# Patient Record
Sex: Female | Born: 1965
Health system: Southern US, Community
[De-identification: ages and names within clinical notes are randomized; demographics above are authoritative.]

## PROBLEM LIST (undated history)

## (undated) DIAGNOSIS — M10071 Idiopathic gout, right ankle and foot: Secondary | ICD-10-CM

## (undated) DIAGNOSIS — K5904 Chronic idiopathic constipation: Secondary | ICD-10-CM

## (undated) DIAGNOSIS — Z860101 Personal history of adenomatous and serrated colon polyps: Secondary | ICD-10-CM

## (undated) DIAGNOSIS — F32A Depression, unspecified: Secondary | ICD-10-CM

## (undated) DIAGNOSIS — E039 Hypothyroidism, unspecified: Secondary | ICD-10-CM

## (undated) DIAGNOSIS — D259 Leiomyoma of uterus, unspecified: Secondary | ICD-10-CM

## (undated) DIAGNOSIS — K5900 Constipation, unspecified: Secondary | ICD-10-CM

## (undated) DIAGNOSIS — J309 Allergic rhinitis, unspecified: Secondary | ICD-10-CM

## (undated) DIAGNOSIS — E785 Hyperlipidemia, unspecified: Secondary | ICD-10-CM

## (undated) DIAGNOSIS — H8110 Benign paroxysmal vertigo, unspecified ear: Secondary | ICD-10-CM

## (undated) DIAGNOSIS — E669 Obesity, unspecified: Secondary | ICD-10-CM

## (undated) DIAGNOSIS — R1111 Vomiting without nausea: Secondary | ICD-10-CM

## (undated) DIAGNOSIS — I10 Essential (primary) hypertension: Secondary | ICD-10-CM

## (undated) DIAGNOSIS — L680 Hirsutism: Secondary | ICD-10-CM

## (undated) DIAGNOSIS — R42 Dizziness and giddiness: Secondary | ICD-10-CM

## (undated) HISTORY — DX: Benign paroxysmal vertigo, unspecified ear: H81.10

## (undated) HISTORY — DX: Allergic rhinitis, unspecified: J30.9

## (undated) HISTORY — DX: Hirsutism: L68.0

## (undated) HISTORY — DX: Vomiting without nausea: R11.11

## (undated) HISTORY — PX: CYST EXCISION: SHX5701

## (undated) HISTORY — DX: Constipation, unspecified: K59.00

## (undated) HISTORY — DX: Idiopathic gout, right ankle and foot: M10.071

## (undated) HISTORY — DX: Dizziness and giddiness: R42

## (undated) HISTORY — DX: Depression, unspecified: F32.A

## (undated) HISTORY — DX: Personal history of adenomatous and serrated colon polyps: Z86.0101

## (undated) HISTORY — DX: Hyperlipidemia, unspecified: E78.5

## (undated) HISTORY — DX: Chronic idiopathic constipation: K59.04

## (undated) HISTORY — DX: Hypothyroidism, unspecified: E03.9

---

## 2014-12-20 ENCOUNTER — Encounter (HOSPITAL_COMMUNITY): Payer: Self-pay | Admitting: *Deleted

## 2014-12-20 ENCOUNTER — Emergency Department (HOSPITAL_COMMUNITY)
Admission: EM | Admit: 2014-12-20 | Discharge: 2014-12-20 | Disposition: A | Discharge status: Home / Self Care | Payer: BLUE CROSS/BLUE SHIELD | Source: Home / Self Care | Attending: Family Medicine | Admitting: Family Medicine

## 2014-12-20 ENCOUNTER — Emergency Department (INDEPENDENT_AMBULATORY_CARE_PROVIDER_SITE_OTHER): Payer: BLUE CROSS/BLUE SHIELD

## 2014-12-20 DIAGNOSIS — M722 Plantar fascial fibromatosis: Secondary | ICD-10-CM

## 2014-12-20 HISTORY — DX: Essential (primary) hypertension: I10

## 2014-12-20 MED ORDER — DICLOFENAC POTASSIUM 50 MG PO TABS: 50.0000 mg | ORAL_TABLET | Freq: Three times a day (TID) | ORAL | Status: DC

## 2014-12-20 NOTE — Discharge Instructions (Signed)
Wear good supportive shoes, stretch toes, use medicine as needed and see podiatrist if further problems.

## 2014-12-20 NOTE — ED Notes (Signed)
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

## 2014-12-20 NOTE — ED Provider Notes (Addendum)
CSN: [id number]     Arrival date & time 12/20/14  1340 History   First MD Initiated Contact with Patient 12/20/14 1425     Chief Complaint  Patient presents with  . Foot Pain   (Consider location/radiation/quality/duration/timing/severity/associated sxs/prior Treatment) Patient is a 49 y.o. female presenting with lower extremity pain. The history is provided by the patient.  Foot Pain This is a new problem. The current episode started yesterday. The problem has been gradually worsening. Associated symptoms comments: Works 2 jobs standing all the time, last week toe dorsiflexed in spasm.. The symptoms are aggravated by walking.    Past Medical History  Diagnosis Date  . Hypertension    Past Surgical History  Procedure Laterality Date  . Cyst excision     No family history on file. History  Substance Use Topics  . Smoking status: Never Smoker   . Smokeless tobacco: Not on file  . Alcohol Use: No   OB History    No data available     Review of Systems  Constitutional: Negative.   Musculoskeletal: Positive for joint swelling and gait problem. Negative for back pain.  Skin: Negative.     Allergies  Review of patient's allergies indicates no known allergies.  Home Medications   Prior to Admission medications   Medication Sig Start Date End Date Taking? Authorizing Provider  HYDROCHLOROTHIAZIDE PO Take by mouth.   Yes Historical Provider, MD  diclofenac (CATAFLAM) 50 MG tablet Take 1 tablet (50 mg total) by mouth 3 (three) times daily. 12/20/14   Billy Fischer, MD   BP 158/92 mmHg  Pulse 68  Temp(Src) 98.7 F (37.1 C) (Oral)  Resp 16  SpO2 98%  LMP 11/30/2014 Physical Exam  Constitutional: She is oriented to person, place, and time. She appears well-developed and well-nourished.  Musculoskeletal: She exhibits tenderness.       Right foot: There is tenderness and bony tenderness. There is normal range of motion, no swelling and no deformity.        Feet:  Neurological: She is alert and oriented to person, place, and time.  Skin: Skin is warm and dry.  Nursing note and vitals reviewed.   ED Course  Procedures (including critical care time) Labs Review Labs Reviewed - No data to display  Imaging Review Dg Foot Complete Right  12/20/2014   CLINICAL DATA:  Pain in the great toe.  No indication of trauma.  EXAM: RIGHT FOOT COMPLETE - 3+ VIEW  COMPARISON:  None.  FINDINGS: No fracture, dislocation, or erosion. No significant joint narrowing. No opaque foreign body.  IMPRESSION: No findings to explain great toe pain.   Electronically Signed   By: Jorje Guild M.D.   On: 12/20/2014 15:36     MDM   1. Plantar fasciitis of right foot        Billy Fischer, MD 12/20/14 [place], MD 12/20/14 [phone]

## 2014-12-20 NOTE — ED Notes (Signed)
Pt  reorts    r  Foot  Pain       Since   Last  Pm   She  Reports  May  Have  Bent        Her  Roe back    - she  Has  Pain in the  Affected foot        With  Pain on  Weight  Bearing

## 2014-12-30 ENCOUNTER — Ambulatory Visit: Payer: BLUE CROSS/BLUE SHIELD | Admitting: Podiatry

## 2015-01-21 ENCOUNTER — Encounter (HOSPITAL_COMMUNITY): Payer: Self-pay | Admitting: Emergency Medicine

## 2015-01-21 ENCOUNTER — Emergency Department (HOSPITAL_COMMUNITY)
Admission: EM | Admit: 2015-01-21 | Discharge: 2015-01-21 | Disposition: A | Discharge status: Home / Self Care | Payer: BLUE CROSS/BLUE SHIELD | Source: Home / Self Care | Attending: Family Medicine | Admitting: Family Medicine

## 2015-01-21 DIAGNOSIS — R0789 Other chest pain: Secondary | ICD-10-CM

## 2015-01-21 DIAGNOSIS — K224 Dyskinesia of esophagus: Secondary | ICD-10-CM

## 2015-01-21 NOTE — ED Notes (Signed)
Patient c/o sudden onset of stabbing pain in mid chest aprox 1 H PTA. Pain not reproducible. No change w ROM, deep breathing, direct pressure to sternal area. W/D/color good, NAD

## 2015-01-21 NOTE — ED Notes (Signed)
Pt states that a hour ago she was drinking a beverage and felt sharp chest pain with dizziness along with diaphoretic episode. Which pt states has now resolved.

## 2015-01-21 NOTE — ED Provider Notes (Signed)
CSN: [id number]     Arrival date & time 01/21/15  1233 History   First MD Initiated Contact with Patient 01/21/15 1249     Chief Complaint  Patient presents with  . Chest Pain   (Consider location/radiation/quality/duration/timing/severity/associated sxs/prior Treatment) HPI     49 year old female with history of hypertension presents for evaluation of chest pain. About one hour prior to arrival she took a drink of a cold soda and had immediate onset of stabbing substernal chest pain. She grew lightheaded diaphoretic and felt short of breath. The pain was severe, 10 out of 10, maximal at onset. She sat down and within a couple of minutes the symptoms had completely resolved. She is currently 100% asymptomatic for the past hour, denies any symptoms and denies any pain with exertion. She had never had this before. No recent travel. No leg pain or swelling. No history of DVT or PE. No family history of early heart disease  Past Medical History  Diagnosis Date  . Hypertension    Past Surgical History  Procedure Laterality Date  . Cyst excision     History reviewed. No pertinent family history. History  Substance Use Topics  . Smoking status: Never Smoker   . Smokeless tobacco: Not on file  . Alcohol Use: No   OB History    No data available     Review of Systems  Constitutional: Positive for diaphoresis. Negative for fever and chills.  Eyes: Negative for visual disturbance.  Respiratory: Positive for shortness of breath. Negative for cough.   Cardiovascular: Positive for chest pain. Negative for palpitations and leg swelling.  Gastrointestinal: Negative for nausea, vomiting and abdominal pain.  Endocrine: Negative for polydipsia and polyuria.  Genitourinary: Negative for dysuria, urgency and frequency.  Musculoskeletal: Negative for myalgias and arthralgias.  Skin: Negative for rash.  Neurological: Positive for dizziness. Negative for weakness.  All other systems reviewed and are  negative.   Allergies  Review of patient's allergies indicates no known allergies.  Home Medications   Prior to Admission medications   Medication Sig Start Date End Date Taking? Authorizing Provider  diclofenac (CATAFLAM) 50 MG tablet Take 1 tablet (50 mg total) by mouth 3 (three) times daily. 12/20/14   Billy Fischer, MD  HYDROCHLOROTHIAZIDE PO Take by mouth.    Historical Provider, MD   BP 128/82 mmHg  Temp(Src) 97.8 F (36.6 C) (Oral)  Resp 14  SpO2 96%  LMP 01/18/2015 Physical Exam  Constitutional: She is oriented to person, place, and time. Vital signs are normal. She appears well-developed and well-nourished. No distress.  HENT:  Head: Normocephalic and atraumatic.  Eyes: Conjunctivae are normal. Right eye exhibits no discharge. Left eye exhibits no discharge. No scleral icterus.  No conjunctival pallor  Neck: Normal range of motion. Neck supple.  Cardiovascular: Normal rate, regular rhythm and normal heart sounds.   Pulmonary/Chest: Effort normal and breath sounds normal. No respiratory distress.  Abdominal: Soft. Bowel sounds are normal. She exhibits no distension and no mass. There is tenderness (minimal epigastric and left upper quadrant tenderness). There is no rebound and no guarding.  Lymphadenopathy:    She has no cervical adenopathy.  Neurological: She is alert and oriented to person, place, and time. She has normal strength. Coordination normal.  Skin: Skin is warm and dry. No rash noted. She is not diaphoretic.  Psychiatric: She has a normal mood and affect. Judgment normal.  Nursing note and vitals reviewed.   ED Course  Procedures (including critical  care time) Labs Review Labs Reviewed - No data to display  Imaging Review No results found.   Date: 01/21/2015  Rate: 62   Rhythm: normal sinus rhythm  QRS Axis: normal  Intervals: normal  ST/T Wave abnormalities: normal  Conduction Disutrbances:none  Narrative Interpretation:   Old EKG Reviewed:  none available Normal EKG, and agree with automatic read   MDM   1. Chest pain, atypical   2. Esophageal spasm    Case discussed with attending.  Most likely esophageal spasm.  She is asymptomatic at this time.  If pain returns, call 911.  Otherwise follow up with GI, referral given        Liam Graham, PA-C 01/21/15 1328

## 2015-01-21 NOTE — Discharge Instructions (Signed)
Chest Pain (Nonspecific) °It is often hard to give a specific diagnosis for the cause of chest pain. There is always a chance that your pain could be related to something serious, such as a heart attack or a blood clot in the lungs. You need to follow up with your health care provider for further evaluation. °CAUSES  °· Heartburn. °· Pneumonia or bronchitis. °· Anxiety or stress. °· Inflammation around your heart (pericarditis) or lung (pleuritis or pleurisy). °· A blood clot in the lung. °· A collapsed lung (pneumothorax). It can develop suddenly on its own (spontaneous pneumothorax) or from trauma to the chest. °· Shingles infection (herpes zoster virus). °The chest wall is composed of bones, muscles, and cartilage. Any of these can be the source of the pain. °· The bones can be bruised by injury. °· The muscles or cartilage can be strained by coughing or overwork. °· The cartilage can be affected by inflammation and become sore (costochondritis). °DIAGNOSIS  °Lab tests or other studies may be needed to find the cause of your pain. Your health care provider may have you take a test called an ambulatory electrocardiogram (ECG). An ECG records your heartbeat patterns over a 24-hour period. You may also have other tests, such as: °· Transthoracic echocardiogram (TTE). During echocardiography, sound waves are used to evaluate how blood flows through your heart. °· Transesophageal echocardiogram (TEE). °· Cardiac monitoring. This allows your health care provider to monitor your heart rate and rhythm in real time. °· Holter monitor. This is a portable device that records your heartbeat and can help diagnose heart arrhythmias. It allows your health care provider to track your heart activity for several days, if needed. °· Stress tests by exercise or by giving medicine that makes the heart beat faster. °TREATMENT  °· Treatment depends on what may be causing your chest pain. Treatment may include: °· Acid blockers for  heartburn. °· Anti-inflammatory medicine. °· Pain medicine for inflammatory conditions. °· Antibiotics if an infection is present. °· You may be advised to change lifestyle habits. This includes stopping smoking and avoiding alcohol, caffeine, and chocolate. °· You may be advised to keep your head raised (elevated) when sleeping. This reduces the chance of acid going backward from your stomach into your esophagus. °Most of the time, nonspecific chest pain will improve within 2-3 days with rest and mild pain medicine.  °HOME CARE INSTRUCTIONS  °· If antibiotics were prescribed, take them as directed. Finish them even if you start to feel better. °· For the next few days, avoid physical activities that bring on chest pain. Continue physical activities as directed. °· Do not use any tobacco products, including cigarettes, chewing tobacco, or electronic cigarettes. °· Avoid drinking alcohol. °· Only take medicine as directed by your health care provider. °· Follow your health care provider's suggestions for further testing if your chest pain does not go away. °· Keep any follow-up appointments you made. If you do not go to an appointment, you could develop lasting (chronic) problems with pain. If there is any problem keeping an appointment, call to reschedule. °SEEK MEDICAL CARE IF:  °· Your chest pain does not go away, even after treatment. °· You have a rash with blisters on your chest. °· You have a fever. °SEEK IMMEDIATE MEDICAL CARE IF:  °· You have increased chest pain or pain that spreads to your arm, neck, jaw, back, or abdomen. °· You have shortness of breath. °· You have an increasing cough, or you cough   up blood. °· You have severe back or abdominal pain. °· You feel nauseous or vomit. °· You have severe weakness. °· You faint. °· You have chills. °This is an emergency. Do not wait to see if the pain will go away. Get medical help at once. Call your local emergency services (911 in [place] Do not drive  yourself to the hospital. °MAKE SURE YOU:  °· Understand these instructions. °· Will watch your condition. °· Will get help right away if you are not doing well or get worse. °Document Released: 08/08/2005 Document Revised: 11/03/2013 Document Reviewed: 06/03/2008 °[employer]® Patient Information ©2015 [employer]. This information is not intended to replace advice given to you by your health care provider. Make sure you discuss any questions you have with your health care provider. °Esophageal Spasm °Esophageal spasm is an uncoordinated contraction of the muscles of the esophagus (the tube which carries food from your mouth to your stomach). Normally, the muscles of the esophagus alternate between contraction and relaxation starting from the top of the esophagus and working down to the bottom. This moves the food from the mouth to the stomach. In esophageal spasm, all the muscles contract at once. This causes pain and fails to move the food along. As a result, you may have trouble swallowing.  °Women are more likely than men to have esophageal spasm. The cause of the spasms is not known. Sometimes eating hot or cold foods triggers the condition and this may be due to an overly sensitive esophagus. This is not an infectious disease and cannot be passed to others. °SYMPTOMS  °Symptoms of esophageal spasm may include: chest pain, burning or pain with swallowing, and difficulty swallowing.  °DIAGNOSIS  °Esophageal spasm can be diagnosed by a test called manometry (pressure studies of the esophagus). In this test, a special tube is inserted down the esophagus. The tube measures the muscle activity of the esophagus. Abnormal contractions mixed with normal movement helps confirm the diagnosis.  °A person with a hypersensitive esophagus may be diagnosed by inflating a long balloon in the person's esophagus. If this causes the same symptoms, preventive methods may work. °PREVENTION  °Avoid hot or cold foods if that seems to  be a trigger. °PROGNOSIS  °This condition does not go away, nor is treatment entirely satisfactory. Patients need to be careful of what they eat. They need to continue on medication if a useful one is found. Fortunately, the condition does not get progressively worse as time passes. °Esophageal spasm does not usually lead to more serious problems but sometimes the pain can be disabling. If a person becomes afraid to eat they may become malnourished and lose weight.  °TREATMENT  °· A procedure in which instruments of increasing size are inserted through the esophagus to enlarge (dilate) it are used. °· Medications that decrease acid-production of the stomach may be used such as proton-pump inhibitors or H2-blockers. °· Medications of several types can be used to relax the muscles of the esophagus. °· An individual with a hypersensitive esophagus sometimes improves with low doses of medications normally used for depression. °· No treatment for esophageal spasm is effective for everyone. Often several approaches will be tried before one works. In many cases, the symptoms will improve, but will not go away completely. °· For severe cases, relief is obtained two-thirds of the time by cutting the muscles along the entire length of the esophagus. This is a major surgical procedure. °· Your symptoms are usually the best guide to   how well the treatment for esophageal spasm works. °SIDE EFFECTS OF TREATMENTS °· Nitrates can cause headaches and low blood pressure. °· Calcium channel blockers can cause: °¨ Feeling sick to your stomach (nausea). °¨ Constipation and other side effects. °· Antidepressants can cause side effects that depend on the medication used. °HOME CARE INSTRUCTIONS  °· Let your caregiver know if problems are getting worse, or if you get food stuck in your esophagus for longer than 1 hour or as directed and are unable to swallow liquid. °· Take medications as directed and with permission of your caregiver. Ask  about what to do if a medication seems to get stuck in your esophagus. Only take over-the-counter or prescription medicines for pain, discomfort, or fever as directed by your caregiver. °· Soft and liquid foods pass more easily than solid pieces. °SEEK IMMEDIATE MEDICAL CARE IF:  °· You develop severe chest pain, especially if the pain is crushing or pressure-like and spreads to the arms, back, neck, or jaw, or if you have sweating, nausea, or shortness of breath. THIS COULD BE AN EMERGENCY. Do not wait to see if the pain will go away. Get medical help at once. Call 911 or 0 (operator). DO NOT drive yourself to the hospital. °· Your chest pain gets worse and does not go away with rest. °· You have an attack of chest pain lasting longer than usual despite rest and treatment with the medications your physician has prescribed. °· You wake from sleep with chest pain or shortness of breath. °· You feel dizzy or faint. °· You have chest pain, not typical of your usual pain, caused by your esophagus for which you originally saw your caregiver. °MAKE SURE YOU:  °· Understand these instructions. °· Will watch your condition. °· Will get help right away if you are not doing well or get worse. °Document Released: 01/19/2003 Document Revised: 01/21/2012 Document Reviewed: 01/22/2014 °[employer]® Patient Information ©2015 [employer]. This information is not intended to replace advice given to you by your health care provider. Make sure you discuss any questions you have with your health care provider. ° °

## 2015-07-25 ENCOUNTER — Other Ambulatory Visit: Payer: Self-pay | Admitting: Occupational Medicine

## 2015-07-25 ENCOUNTER — Ambulatory Visit: Payer: Self-pay

## 2015-07-25 DIAGNOSIS — M25511 Pain in right shoulder: Secondary | ICD-10-CM

## 2015-09-15 ENCOUNTER — Emergency Department (HOSPITAL_COMMUNITY): Payer: BLUE CROSS/BLUE SHIELD

## 2015-09-15 ENCOUNTER — Encounter (HOSPITAL_COMMUNITY): Payer: Self-pay | Admitting: *Deleted

## 2015-09-15 ENCOUNTER — Emergency Department (HOSPITAL_COMMUNITY)
Admission: EM | Admit: 2015-09-15 | Discharge: 2015-09-15 | Discharge status: Against Medical Advice | Payer: BLUE CROSS/BLUE SHIELD | Attending: Emergency Medicine | Admitting: Emergency Medicine

## 2015-09-15 DIAGNOSIS — R9431 Abnormal electrocardiogram [ECG] [EKG]: Secondary | ICD-10-CM

## 2015-09-15 DIAGNOSIS — R0602 Shortness of breath: Secondary | ICD-10-CM | POA: Insufficient documentation

## 2015-09-15 DIAGNOSIS — E669 Obesity, unspecified: Secondary | ICD-10-CM | POA: Insufficient documentation

## 2015-09-15 DIAGNOSIS — I10 Essential (primary) hypertension: Secondary | ICD-10-CM

## 2015-09-15 DIAGNOSIS — R531 Weakness: Secondary | ICD-10-CM | POA: Insufficient documentation

## 2015-09-15 DIAGNOSIS — E876 Hypokalemia: Secondary | ICD-10-CM | POA: Diagnosis present

## 2015-09-15 DIAGNOSIS — N390 Urinary tract infection, site not specified: Secondary | ICD-10-CM

## 2015-09-15 DIAGNOSIS — Z791 Long term (current) use of non-steroidal anti-inflammatories (NSAID): Secondary | ICD-10-CM | POA: Insufficient documentation

## 2015-09-15 DIAGNOSIS — R079 Chest pain, unspecified: Secondary | ICD-10-CM

## 2015-09-15 DIAGNOSIS — R739 Hyperglycemia, unspecified: Secondary | ICD-10-CM | POA: Diagnosis present

## 2015-09-15 DIAGNOSIS — R55 Syncope and collapse: Secondary | ICD-10-CM | POA: Diagnosis present

## 2015-09-15 DIAGNOSIS — R072 Precordial pain: Secondary | ICD-10-CM | POA: Diagnosis present

## 2015-09-15 DIAGNOSIS — Z79899 Other long term (current) drug therapy: Secondary | ICD-10-CM | POA: Insufficient documentation

## 2015-09-15 HISTORY — DX: Obesity, unspecified: E66.9

## 2015-09-15 HISTORY — DX: Essential (primary) hypertension: I10

## 2015-09-15 HISTORY — DX: Abnormal electrocardiogram (ECG) (EKG): R94.31

## 2015-09-15 LAB — TROPONIN I

## 2015-09-15 LAB — CBC
HEMATOCRIT: 42.4 % (ref 36.0–46.0)
HEMOGLOBIN: 14.2 g/dL (ref 12.0–15.0)
MCH: 27.5 pg (ref 26.0–34.0)
MCHC: 33.5 g/dL (ref 30.0–36.0)
MCV: 82 fL (ref 78.0–100.0)
Platelets: 336 10*3/uL (ref 150–400)
RBC: 5.17 MIL/uL — AB (ref 3.87–5.11)
RDW: 13.4 % (ref 11.5–15.5)
WBC: 12.8 10*3/uL — ABNORMAL HIGH (ref 4.0–10.5)

## 2015-09-15 LAB — CBG MONITORING, ED: GLUCOSE-CAPILLARY: 99 mg/dL (ref 65–99)

## 2015-09-15 LAB — URINALYSIS, ROUTINE W REFLEX MICROSCOPIC
Bilirubin Urine: NEGATIVE
Glucose, UA: NEGATIVE mg/dL
Hgb urine dipstick: NEGATIVE
Ketones, ur: NEGATIVE mg/dL
Nitrite: NEGATIVE
PH: 5 (ref 5.0–8.0)
Protein, ur: NEGATIVE mg/dL
Specific Gravity, Urine: 1.02 (ref 1.005–1.030)
Urobilinogen, UA: 0.2 mg/dL (ref 0.0–1.0)

## 2015-09-15 LAB — URINE MICROSCOPIC-ADD ON

## 2015-09-15 LAB — BASIC METABOLIC PANEL
ANION GAP: 11 (ref 5–15)
BUN: 20 mg/dL (ref 6–20)
CHLORIDE: 94 mmol/L — AB (ref 101–111)
CO2: 29 mmol/L (ref 22–32)
Calcium: 9.4 mg/dL (ref 8.9–10.3)
Creatinine, Ser: 1.12 mg/dL — ABNORMAL HIGH (ref 0.44–1.00)
GFR, EST NON AFRICAN AMERICAN: 56 mL/min — AB (ref >60)
Glucose, Bld: 164 mg/dL — ABNORMAL HIGH (ref 65–99)
Potassium: 2.7 mmol/L — CL (ref 3.5–5.1)
SODIUM: 134 mmol/L — AB (ref 135–145)

## 2015-09-15 LAB — MAGNESIUM: Magnesium: 1.9 mg/dL (ref 1.7–2.4)

## 2015-09-15 MED ORDER — DEXTROSE 5 % IV SOLN
1.0000 g | Freq: Once | INTRAVENOUS | Status: AC
  Administered 2015-09-15: 1 g via INTRAVENOUS
  Filled 2015-09-15: qty 10

## 2015-09-15 MED ORDER — NITROFURANTOIN MONOHYD MACRO 100 MG PO CAPS: 100.0000 mg | ORAL_CAPSULE | Freq: Two times a day (BID) | ORAL | Status: DC

## 2015-09-15 MED ORDER — ASPIRIN 81 MG PO CHEW
324.0000 mg | CHEWABLE_TABLET | Freq: Once | ORAL | Status: AC
  Administered 2015-09-15: 324 mg via ORAL
  Filled 2015-09-15: qty 4

## 2015-09-15 MED ORDER — AMLODIPINE BESYLATE 5 MG PO TABS: 5.0000 mg | ORAL_TABLET | Freq: Every day | ORAL | Status: DC

## 2015-09-15 MED ORDER — TRIAMTERENE-HCTZ 37.5-25 MG PO TABS: 0.5000 | ORAL_TABLET | Freq: Every day | ORAL | Status: DC | PRN

## 2015-09-15 MED ORDER — SODIUM CHLORIDE 0.9 % IV BOLUS (SEPSIS)
1000.0000 mL | Freq: Once | INTRAVENOUS | Status: AC
  Administered 2015-09-15: 1000 mL via INTRAVENOUS

## 2015-09-15 MED ORDER — POTASSIUM CHLORIDE 10 MEQ/100ML IV SOLN
10.0000 meq | INTRAVENOUS | Status: AC
  Administered 2015-09-15 (×2): 10 meq via INTRAVENOUS
  Filled 2015-09-15 (×2): qty 100

## 2015-09-15 MED ORDER — POTASSIUM CHLORIDE CRYS ER 20 MEQ PO TBCR
40.0000 meq | EXTENDED_RELEASE_TABLET | Freq: Once | ORAL | Status: AC
  Administered 2015-09-15: 40 meq via ORAL
  Filled 2015-09-15: qty 2

## 2015-09-15 NOTE — ED Provider Notes (Signed)
CSN: [id number]     Arrival date & time 09/15/15  1143 History   First MD Initiated Contact with Patient 09/15/15 1359     Chief Complaint  Patient presents with  . Dizziness     (Consider location/radiation/quality/duration/timing/severity/associated sxs/prior Treatment) HPI Comments: Patient with history of hypertension presenting from work with onset of dizziness, diaphoresis and chest pain. States she's not felt well for the past week with intermittent lightheadedness and nausea and poor appetite. She felt like she was going to pass out today so decided to come to the ED. She was started on hydrochlorothiazide for blood pressure by urgent care about a week ago. Today she developed central dull chest pain associated with shortness of breath and diaphoresis and dizziness. This pain is still ongoing. She denies any focal weakness, numbness or tingling. She denies any headache. She denies any abdominal pain nausea or vomiting.  The history is provided by the patient.    Past Medical History  Diagnosis Date  . Hypertension   . Obesity    Past Surgical History  Procedure Laterality Date  . Cyst excision     Family History  Problem Relation Age of Onset  . Hypertension Father   . Diabetes Father   . Hypertension Mother    Social History  Substance Use Topics  . Smoking status: Never Smoker   . Smokeless tobacco: None  . Alcohol Use: No   OB History    No data available     Review of Systems  Constitutional: Negative for fever, activity change and appetite change.  HENT: Negative for congestion.   Eyes: Negative for visual disturbance.  Respiratory: Positive for cough and shortness of breath. Negative for chest tightness and wheezing.   Cardiovascular: Positive for chest pain. Negative for palpitations.  Gastrointestinal: Negative for nausea, vomiting and abdominal pain.  Genitourinary: Negative for dysuria, hematuria, vaginal bleeding and vaginal discharge.   Musculoskeletal: Negative for myalgias and arthralgias.  Skin: Negative for rash.  Neurological: Positive for dizziness and weakness. Negative for light-headedness and headaches.   A complete 10 system review of systems was obtained and all systems are negative except as noted in the HPI and PMH.     Allergies  Review of patient's allergies indicates no known allergies.  Home Medications   Prior to Admission medications   Medication Sig Start Date End Date Taking? Authorizing Provider  hydrochlorothiazide (HYDRODIURIL) 25 MG tablet Take 25 mg by mouth daily.   Yes Historical Provider, MD  ibuprofen (ADVIL,MOTRIN) 800 MG tablet Take 800 mg by mouth daily.   Yes Historical Provider, MD  methocarbamol (ROBAXIN) 750 MG tablet Take 750 mg by mouth daily.   Yes Historical Provider, MD   BP 120/63 mmHg  Pulse 63  Temp(Src) 98 F (36.7 C) (Oral)  Resp 13  SpO2 96%  LMP 08/19/2015 Physical Exam  Constitutional: She is oriented to person, place, and time. She appears well-developed and well-nourished. No distress.  HENT:  Head: Normocephalic and atraumatic.  Mouth/Throat: Oropharynx is clear and moist. No oropharyngeal exudate.  Eyes: Conjunctivae and EOM are normal. Pupils are equal, round, and reactive to light.  Neck: Normal range of motion. Neck supple.  No meningismus.  Cardiovascular: Normal rate, regular rhythm, normal heart sounds and intact distal pulses.   No murmur heard. Pulmonary/Chest: Effort normal and breath sounds normal. No respiratory distress.  Abdominal: Soft. There is no tenderness. There is no rebound and no guarding.  Musculoskeletal: Normal range of motion. She exhibits  no edema or tenderness.  Neurological: She is alert and oriented to person, place, and time. No cranial nerve deficit. She exhibits normal muscle tone. Coordination normal.  No ataxia on finger to nose bilaterally. No pronator drift. 5/5 strength throughout. CN 2-12 intact. Negative Romberg.  Equal grip strength. Sensation intact. Gait is normal.   Skin: Skin is warm.  Psychiatric: She has a normal mood and affect. Her behavior is normal.  Nursing note and vitals reviewed.   ED Course  Procedures (including critical care time) Labs Review Labs Reviewed  BASIC METABOLIC PANEL - Abnormal; Notable for the following:    Sodium 134 (*)    Potassium 2.7 (*)    Chloride 94 (*)    Glucose, Bld 164 (*)    Creatinine, Ser 1.12 (*)    GFR calc non Af Amer 56 (*)    All other components within normal limits  CBC - Abnormal; Notable for the following:    WBC 12.8 (*)    RBC 5.17 (*)    All other components within normal limits  URINALYSIS, ROUTINE W REFLEX MICROSCOPIC (NOT AT [facility]) - Abnormal; Notable for the following:    APPearance CLOUDY (*)    Leukocytes, UA MODERATE (*)    All other components within normal limits  URINE MICROSCOPIC-ADD ON - Abnormal; Notable for the following:    Squamous Epithelial / LPF FEW (*)    Bacteria, UA MANY (*)    All other components within normal limits  MAGNESIUM  TROPONIN I  HEMOGLOBIN A1C  CBG MONITORING, ED    Imaging Review Dg Chest 2 View  09/15/2015  CLINICAL DATA:  Central chest pain with nausea EXAM: CHEST  2 VIEW COMPARISON:  None FINDINGS: The heart size and mediastinal contours are within normal limits. Both lungs are clear. The visualized skeletal structures are unremarkable. IMPRESSION: No active cardiopulmonary disease. Electronically Signed   By: Kerby Moors M.D.   On: 09/15/2015 15:04   I have personally reviewed and evaluated these images and lab results as part of my medical decision-making.   EKG Interpretation   Date/Time:  Thursday September 15 2015 11:56:29 EDT Ventricular Rate:  84 PR Interval:  160 QRS Duration: 98 QT Interval:  406 QTC Calculation: 479 R Axis:   -53 Text Interpretation:  Normal sinus rhythm Left anterior fascicular block  Cannot rule out Anterior infarct , age undetermined Abnormal ECG  inferior  and lateral  ST depression Confirmed by Wyvonnia Dusky  MD, Delshon Blanchfield [phone]) on  09/15/2015 2:03:58 PM      MDM   Final diagnoses:  Chest pain, unspecified chest pain type  Urinary tract infection without hematuria, site unspecified  Hypokalemia   Patient with lightheadedness, dizziness, near syncope episode of chest pain. EKG does show new ST depression inferior laterally.  Orthostatics are negative. Hypokalemia likely from her chlorothiazide use. We'll replete.  Chest x-ray is negative. Troponin negative.  Discussed with cardiology who will evaluate. Care assumed by Dr. Lacinda Axon. Rocephin given for UTI.Marland Kitchen    Ezequiel Essex, MD 09/15/15 [phone]

## 2015-09-15 NOTE — Discharge Instructions (Signed)
The cardiologist has recommended admission to the hospital. It is your decision to go home. They have called in your blood pressure medication to your pharmacy. Nurse will give you an additional prescription for a urinary antibiotic. Medical follow-up provided in discharge paperwork.

## 2015-09-15 NOTE — ED Provider Notes (Signed)
Cardiology consult completed. Cardiology recommended admission, but patient preferred to treat as an outpatient.  New antihypertensive medications prescribed along with Macrobid twice a day for urinary tract infection. Patient will get primary care follow-up.  Nat Christen, MD 09/15/15 Lurena Nida

## 2015-09-15 NOTE — ED Notes (Signed)
Patient reports she started feeling dizzy while at work this am with diaphoresis with mild chest pain. Patients reports she is now on HCTZ, started taking a week ago.

## 2015-09-15 NOTE — Consult Note (Signed)
CARDIOLOGY CONSULT NOTE   Patient ID: Kendra Castro MRN: [id number] DOB/AGE: 05-31-1966 49 y.o.  Admit date: 09/15/2015  Primary Physician   No PCP Per Patient - got Rx from Urgent Care on [address], otherwise, no checkup for years Primary Cardiologist   New Reason for Consultation  Near-syncope  Kendra Castro is a 49 y.o. year old female with a history of HTN. She was started on HCTZ 1 week ago. Followup in January planned. Her BP was still elevated.   She has had some cramping in her back. She had problems yesterday with SOB with exertion, more severe than usual. + LE edema for years, worse recently. Some mild chest pain, not associated with exertion. Feels she is under a lot of stress now due to social issues. No history of pre-syncope or feeling light-headed.   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 She became diaphoretic, denies weakness or feeling light-headed. Possible palpitations, not sure. Called for job assistance which relaxed her a little. Later, had sudden onset of weakness, lost vision, felt she would fall. Felt she was blacking out. This made her anxious. She got someone to take her place and went for help. They told her her BP was low, but as she rested, her BP came up and was elevated. As her BP came up, the sweating and pre-syncope resolved.   In the ER, she has had some mild chest pain which she sometimes gets at work and also has a history of neck pain at times. The neck pain got better when she started the HCTZ. She gets the chest pain 3-5 x week, 3/10, not exertional. She tries to rest and relax, to help it resolve. Lasts 5 minutes at the most. Resting comfortably now. Not aware of CBG ever being elevated, had milk and toast for breakfast.  Past Medical History  Diagnosis Date  . Hypertension   . Obesity     Past Surgical History  Procedure Laterality Date  . Cyst excision      No Known Allergies  I have reviewed  the patient's current medications Prior to Admission medications   Medication Sig Start Date End Date Taking? Authorizing Provider  hydrochlorothiazide (HYDRODIURIL) 25 MG tablet Take 25 mg by mouth daily.   Yes Historical Provider, MD  ibuprofen (ADVIL,MOTRIN) 800 MG tablet Take 800 mg by mouth daily.   Yes Historical Provider, MD  methocarbamol (ROBAXIN) 750 MG tablet Take 750 mg by mouth daily.   Yes Historical Provider, MD     Social History   Social History  . Marital Status: Single    Spouse Name: N/A  . Number of Children: N/A  . Years of Education: N/A   Occupational History  . [employer] subcontracted employee    Social History Main Topics  . Smoking status: Never Smoker   . Smokeless tobacco: Not on file  . Alcohol Use: No  . Drug Use: No  . Sexual Activity: Not on file   Other Topics Concern  . Not on file   Social History Narrative   Pt lives with father and brother-in-law's brother in [place].    Family Status  Relation Status Death Age  . Father Alive     born in [birth year]  . Mother Deceased 43    pancreatic CA and brain tumor   Family History  Problem Relation Age of Onset  . Hypertension Father   . Diabetes Father   .  Hypertension Mother      ROS: Menstrual cycle was never completely regular but periods have lightened, occ hot flashes and  Full 14 point review of systems complete and found to be negative unless listed above.  Physical Exam: Blood pressure 120/63, pulse 63, temperature 98 F (36.7 C), temperature source Oral, resp. rate 13, last menstrual period 08/19/2015, SpO2 96 %.  General: Well developed, well nourished, female in no acute distress Head: Eyes PERRLA, No xanthomas.   Normocephalic and atraumatic, oropharynx without edema or exudate. Dentition: good Lungs: CTA bilaterally Heart: HRRR S1 S2, no rub/gallop, no murmur. pulses are 2+ all 4 extrem.   Neck: No carotid bruits. No lymphadenopathy.  JVD not elevated. Abdomen: Bowel sounds  present, abdomen soft and non-tender without masses or hernias noted. Msk:  No spine or cva tenderness. No weakness, no joint deformities or effusions. Extremities: No clubbing or cyanosis.  edema.  Neuro: Alert and oriented X 3. No focal deficits noted. Psych:  Good affect, responds appropriately Skin: No rashes or lesions noted.  Labs:   Lab Results  Component Value Date   WBC 12.8* 09/15/2015   HGB 14.2 09/15/2015   HCT 42.4 09/15/2015   MCV 82.0 09/15/2015   PLT 336 09/15/2015     Recent Labs Lab 09/15/15 1206  NA 134*  K 2.7*  CL 94*  CO2 29  BUN 20  CREATININE 1.12*  CALCIUM 9.4  GLUCOSE 164*   MAGNESIUM  Date Value Ref Range Status  09/15/2015 1.9 1.7 - 2.4 mg/dL Final    Recent Labs  09/15/15 1428  TROPONINI <0.03   ECG:  SR, mild diffuse flat ST depression - minor differences from 01/2015 ECG  Radiology:  Dg Chest 2 View 09/15/2015  CLINICAL DATA:  Central chest pain with nausea EXAM: CHEST  2 VIEW COMPARISON:  None FINDINGS: The heart size and mediastinal contours are within normal limits. Both lungs are clear. The visualized skeletal structures are unremarkable. IMPRESSION: No active cardiopulmonary disease. Electronically Signed   By: Kerby Moors M.D.   On: 09/15/2015 15:04    ASSESSMENT AND PLAN:   The patient was seen today by Dr Radford Pax, the patient evaluated and the data reviewed.   Principal Problem:   Near syncope - Concern for dehydration with recent diuretic start - would keep on telemetry for 24 hr to r/o arrhythmia in setting of low K+  Active Problems:   Hypokalemia due to loss of potassium - supplemented by ER - recheck     Abnormal ECG - some changes from 01/2015    Precordial chest pain - initial ez negative    Benign essential HTN - HCTZ probably not the best, will change to Maxzide and make a low dose daily prn for edema - add amlodipine and watch for edema    Hyperglycemia - A1c ordered, f/u on results   Plan:  Cardiology recommends overnight admission for further evaluation of multiple medical problems. However, the patient is refusing this. Case management has seen, set up a primary MD appointment.   The risks of leaving AMA were discussed with the patient, including MI, death, and stroke. Her potassium level is not known to be normalized and other electrolytes were abnormal as well. However, she is firm in her desire to leave. The BP prescriptions were sent in and we will call to offer her a f/u appt. She will need to sign the AMA form.   SignedLenoard Aden 09/15/2015 5:40 PM Beeper [phone]  Co-Sign MD

## 2015-09-16 ENCOUNTER — Ambulatory Visit: Payer: Self-pay | Admitting: Family Medicine

## 2015-09-16 LAB — HEMOGLOBIN A1C
Hgb A1c MFr Bld: 5.6 % (ref 4.8–5.6)
MEAN PLASMA GLUCOSE: 114 mg/dL

## 2015-09-20 ENCOUNTER — Telehealth: Payer: Self-pay

## 2015-09-20 NOTE — Telephone Encounter (Signed)
Patient called in stating that she is having painful muscle cramps.  She was in hospital. She started 3 new meds Saturday (Amlodipine, Triamterene, and Nitrofurantoin) and the muscle cramps started Sunday.  Please call her and advise. Thanks  [phone]

## 2015-09-22 ENCOUNTER — Telehealth: Payer: Self-pay | Admitting: Physician Assistant

## 2015-09-22 NOTE — Telephone Encounter (Signed)
Kendra Castro had called and left a message saying she was cramping very badly.  I called the patient and advised her that her potassium or magnesium were possibly low again. I requested that she quit taking the triamterene and going get blood work checked.   I advised her that we had been trying to contact her for follow-up appointment and she should contact us if she wished a follow-up.  [name], Hershal Coria 09/22/2015 12:49 PM Beeper [phone]

## 2016-03-20 IMAGING — DX DG FOOT COMPLETE 3+V*R*
3 series · 3 of 3 positions shown · non-contrast
Comparison: None.

CLINICAL DATA: Pain in the great toe.  No indication of trauma.

EXAM:
RIGHT FOOT COMPLETE - 3+ VIEW

[foot ap]
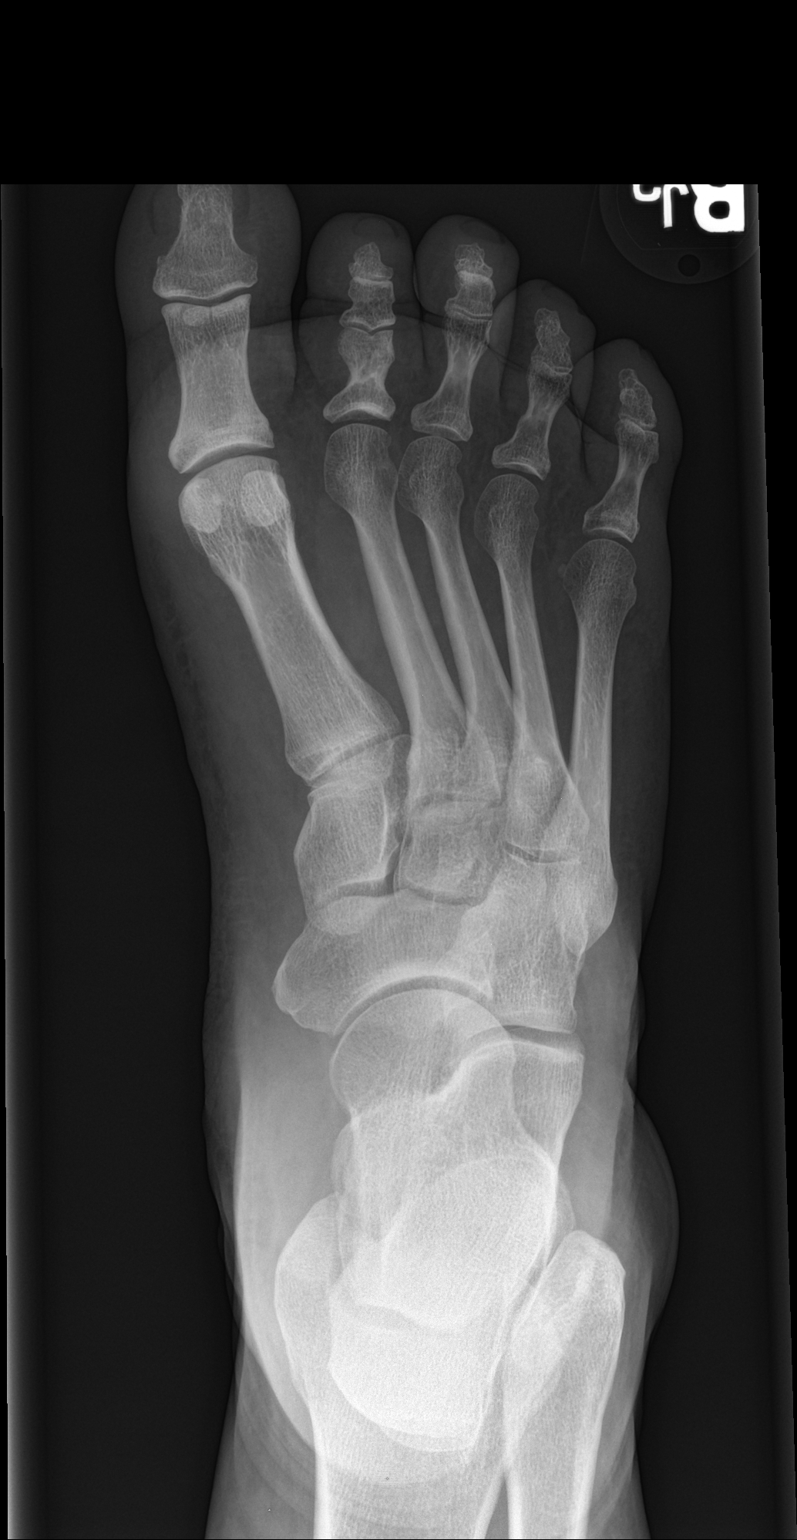

[foot obl]
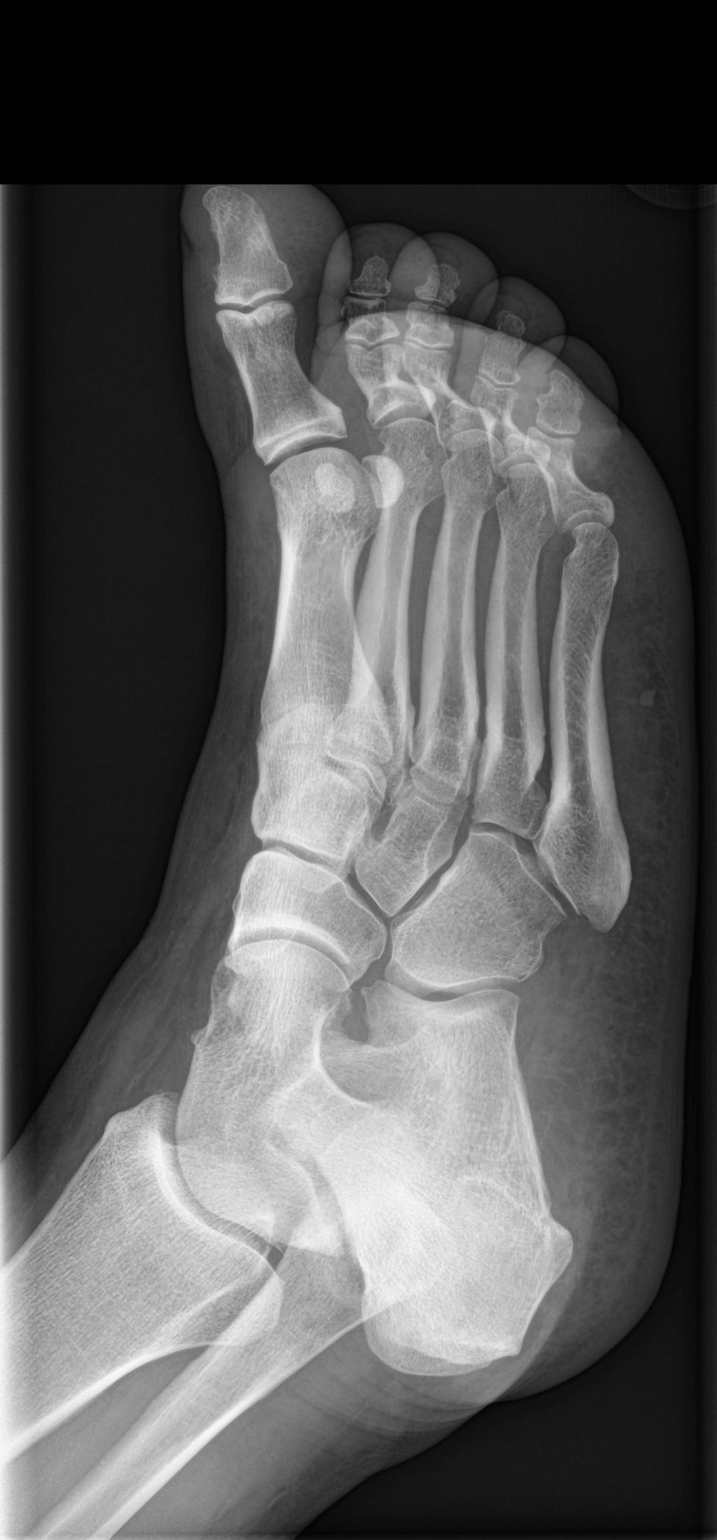

[foot lat]
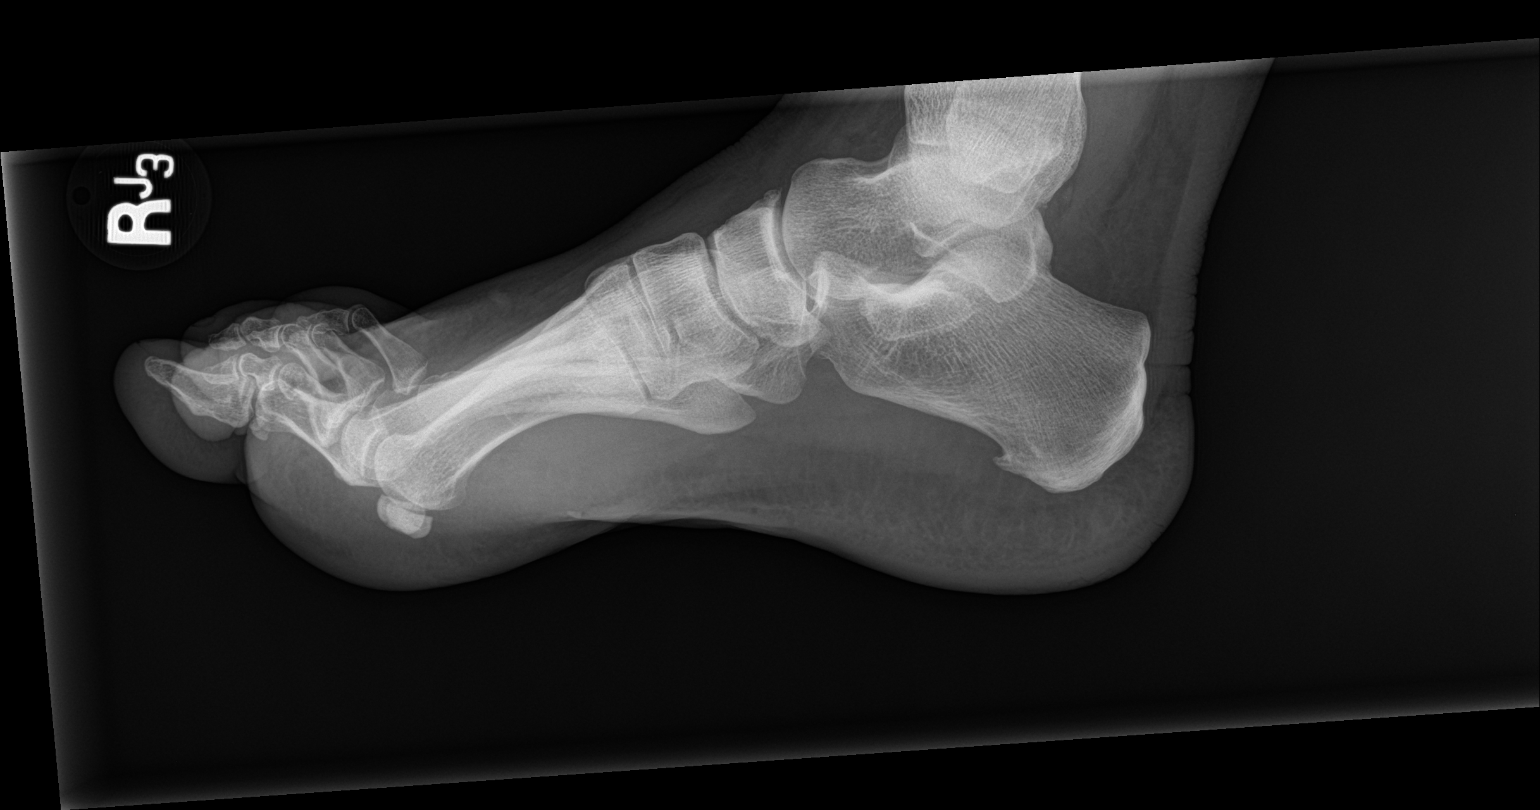

[3 of 3 positions shown; findings below may reference images not displayed]

FINDINGS: No fracture, dislocation, or erosion. No significant joint
narrowing. No opaque foreign body.
IMPRESSION: No findings to explain great toe pain.

## 2016-05-16 DIAGNOSIS — J069 Acute upper respiratory infection, unspecified: Secondary | ICD-10-CM | POA: Diagnosis not present

## 2016-05-16 DIAGNOSIS — K047 Periapical abscess without sinus: Secondary | ICD-10-CM | POA: Diagnosis not present

## 2016-06-27 DIAGNOSIS — Z01411 Encounter for gynecological examination (general) (routine) with abnormal findings: Secondary | ICD-10-CM | POA: Diagnosis not present

## 2016-06-27 DIAGNOSIS — N926 Irregular menstruation, unspecified: Secondary | ICD-10-CM | POA: Diagnosis not present

## 2016-06-27 DIAGNOSIS — R109 Unspecified abdominal pain: Secondary | ICD-10-CM | POA: Diagnosis not present

## 2016-06-29 ENCOUNTER — Encounter: Payer: Self-pay | Admitting: Gastroenterology

## 2016-08-09 ENCOUNTER — Ambulatory Visit: Payer: BLUE CROSS/BLUE SHIELD | Admitting: Women's Health

## 2016-08-09 DIAGNOSIS — Z0289 Encounter for other administrative examinations: Secondary | ICD-10-CM

## 2016-09-06 ENCOUNTER — Encounter: Payer: Self-pay | Admitting: Gastroenterology

## 2016-09-06 ENCOUNTER — Ambulatory Visit (INDEPENDENT_AMBULATORY_CARE_PROVIDER_SITE_OTHER): Payer: Managed Care, Other (non HMO) | Admitting: Gastroenterology

## 2016-09-06 ENCOUNTER — Encounter (INDEPENDENT_AMBULATORY_CARE_PROVIDER_SITE_OTHER): Payer: Self-pay

## 2016-09-06 VITALS — BP 146/92 | HR 77 | Ht 64.25 in | Wt 245.5 lb

## 2016-09-06 DIAGNOSIS — K921 Melena: Secondary | ICD-10-CM

## 2016-09-06 DIAGNOSIS — K5900 Constipation, unspecified: Secondary | ICD-10-CM | POA: Diagnosis not present

## 2016-09-06 DIAGNOSIS — K219 Gastro-esophageal reflux disease without esophagitis: Secondary | ICD-10-CM | POA: Diagnosis not present

## 2016-09-06 DIAGNOSIS — R1031 Right lower quadrant pain: Secondary | ICD-10-CM | POA: Diagnosis not present

## 2016-09-06 MED ORDER — NA SULFATE-K SULFATE-MG SULF 17.5-3.13-1.6 GM/177ML PO SOLN: 1.0000 | Freq: Once | ORAL | 0 refills | Status: AC

## 2016-09-06 MED ORDER — OMEPRAZOLE 20 MG PO CPDR: 20.0000 mg | DELAYED_RELEASE_CAPSULE | Freq: Every day | ORAL | 11 refills | Status: DC

## 2016-09-06 NOTE — Progress Notes (Signed)
    History of Present Illness: This is a 50 year old female referred by [facility] urgent care for the evaluation of constipation, hematochezia, lactose intolerance, RLQ pain, GERD. Unfortunately we do not have referral records. Patient relates a long history of intermittent constipation with hard stools. She occasionally notes small amounts of rectal bleeding with hard stools. She has long-term lactose intolerance leading to diarrhea. She has had episodes of severe right lower quadrant cramping associated with bending or remaining in the seated position for a long period of time. The symptoms do not appear to have any relationship with bowel function. She has frequent reflux symptoms exacerbated by sodas and spicy foods. She does not recall if she had blood work or other testing at her [facility] urgent care visit. She states she was given samples of a medication and then a prescription which she did not fill. She does not recall the name of the medication what the medication was for. She states she took the samples and they did not seem to improve any symptoms. Denies weight loss, change in stool caliber, melena, nausea, vomiting, dysphagia, chest pain.  Review of Systems: Pertinent positive and negative review of systems were noted in the above HPI section. All other review of systems were otherwise negative.  Current Medications, Allergies, Past Medical History, Past Surgical History, Family History and Social History were reviewed in [employer] record.  Physical Exam: General: Well developed, well nourished, no acute distress Head: Normocephalic and atraumatic Eyes:  sclerae anicteric, EOMI Ears: Normal auditory acuity Mouth: No deformity or lesions Neck: Supple, no masses or thyromegaly Lungs: Clear throughout to auscultation Heart: Regular rate and rhythm; no murmurs, rubs or bruits Abdomen: Soft, non tender and non distended. No masses, hepatosplenomegaly or  hernias noted. Normal Bowel sounds Rectal: deferred to colonoscopy Musculoskeletal: Symmetrical with no gross deformities  Skin: No lesions on visible extremities Pulses:  Normal pulses noted Extremities: No clubbing, cyanosis, edema or deformities noted Neurological: Alert oriented x 4, grossly nonfocal Cervical Nodes:  No significant cervical adenopathy Inguinal Nodes: No significant inguinal adenopathy Psychological:  Alert and cooperative. Normal mood and affect  Assessment and Recommendations:  1. Intermittent constipation. Increase daily dietary fiber and daily water intake. Begin MiraLAX once daily. Request records from [facility] urgent care.   2. Intermittent hematochezia. Suspect a benign anorectal source such as hemorrhoids however need to exclude colorectal neoplasms. Schedule colonoscopy. The risks, benefits, and alternatives to colonoscopy with possible biopsy and possible polypectomy were discussed with the patient and they consent to proceed.   3. GERD. Follow standard antireflux measures and begin omeprazole 20 mg daily.  4. Right lower quadrant pain-does not appear to be GI related. Likely musculoskeletal.  5. Lactose intolerance. Avoid lactose products or use Lactaid tablets as needed.   cc: [facility] urgent care

## 2016-09-06 NOTE — Patient Instructions (Signed)
Start over the counter Miralax mixing 17 grams in 8 oz of water daily.   We have sent the following medications to your pharmacy for you to pick up at your convenience: Omeprazole.   You have been scheduled for a colonoscopy. Please follow written instructions given to you at your visit today.  Please pick up your prep supplies at the pharmacy within the next 1-3 days. If you use inhalers (even only as needed), please bring them with you on the day of your procedure. Your physician has requested that you go to www.startemmi.com and enter the access code given to you at your visit today. This web site gives a general overview about your procedure. However, you should still follow specific instructions given to you by our office regarding your preparation for the procedure.   Normal BMI (Body Mass Index- based on height and weight) is between 19 and 25. Your BMI today is Body mass index is 41.81 kg/m. Marland Kitchen Please consider follow up  regarding your BMI with your Primary Care Provider.  Thank you for choosing me and [facility].  Pricilla Riffle. Dagoberto Ligas., MD., Marval Regal

## 2016-09-13 ENCOUNTER — Telehealth: Payer: Self-pay | Admitting: Gastroenterology

## 2016-09-13 NOTE — Telephone Encounter (Signed)
Returned [employer] call and informed her that we do not do PA's of preps since they are never covered but I do have a "pay no more than 50 dollars" coupon I can fax to her. Johann Capers states that she will run the coupon. Faxed coupon along with the paper to inform pharmcisit how to run it.

## 2016-10-29 ENCOUNTER — Encounter: Payer: Self-pay | Admitting: Gastroenterology

## 2016-11-09 ENCOUNTER — Encounter: Payer: Self-pay | Admitting: Gastroenterology

## 2017-03-27 ENCOUNTER — Ambulatory Visit (INDEPENDENT_AMBULATORY_CARE_PROVIDER_SITE_OTHER): Payer: BLUE CROSS/BLUE SHIELD | Admitting: Gynecology

## 2017-03-27 ENCOUNTER — Encounter: Payer: Self-pay | Admitting: Gynecology

## 2017-03-27 VITALS — BP 134/82 | Ht 65.0 in | Wt 262.0 lb

## 2017-03-27 DIAGNOSIS — N926 Irregular menstruation, unspecified: Secondary | ICD-10-CM | POA: Diagnosis not present

## 2017-03-27 DIAGNOSIS — E669 Obesity, unspecified: Secondary | ICD-10-CM

## 2017-03-27 DIAGNOSIS — Z01419 Encounter for gynecological examination (general) (routine) without abnormal findings: Secondary | ICD-10-CM | POA: Diagnosis not present

## 2017-03-27 LAB — LIPID PANEL
CHOL/HDL RATIO: 4 ratio (ref <5.0)
CHOLESTEROL: 162 mg/dL (ref <200)
HDL: 41 mg/dL — AB (ref >50)
LDL Cholesterol: 89 mg/dL (ref <100)
Triglycerides: 159 mg/dL — ABNORMAL HIGH (ref <150)
VLDL: 32 mg/dL — AB (ref <30)

## 2017-03-27 LAB — COMPREHENSIVE METABOLIC PANEL
ALBUMIN: 3.9 g/dL (ref 3.6–5.1)
ALT: 10 U/L (ref 6–29)
AST: 18 U/L (ref 10–35)
Alkaline Phosphatase: 65 U/L (ref 33–130)
BUN: 13 mg/dL (ref 7–25)
CHLORIDE: 102 mmol/L (ref 98–110)
CO2: 23 mmol/L (ref 20–31)
CREATININE: 1.05 mg/dL (ref 0.50–1.05)
Calcium: 9.2 mg/dL (ref 8.6–10.4)
Glucose, Bld: 84 mg/dL (ref 65–99)
Potassium: 4.5 mmol/L (ref 3.5–5.3)
SODIUM: 138 mmol/L (ref 135–146)
TOTAL PROTEIN: 7.3 g/dL (ref 6.1–8.1)
Total Bilirubin: 0.4 mg/dL (ref 0.2–1.2)

## 2017-03-27 LAB — CBC WITH DIFFERENTIAL/PLATELET
BASOS ABS: 0 {cells}/uL (ref 0–200)
Basophils Relative: 0 %
EOS ABS: 270 {cells}/uL (ref 15–500)
EOS PCT: 3 %
HCT: 38.7 % (ref 35.0–45.0)
Hemoglobin: 12.7 g/dL (ref 11.7–15.5)
Lymphocytes Relative: 21 %
Lymphs Abs: 1890 cells/uL (ref 850–3900)
MCH: 27.4 pg (ref 27.0–33.0)
MCHC: 32.8 g/dL (ref 32.0–36.0)
MCV: 83.6 fL (ref 80.0–100.0)
MONOS PCT: 4 %
MPV: 9.4 fL (ref 7.5–12.5)
Monocytes Absolute: 360 cells/uL (ref 200–950)
NEUTROS PCT: 72 %
Neutro Abs: 6480 cells/uL (ref 1500–7800)
PLATELETS: 328 10*3/uL (ref 140–400)
RBC: 4.63 MIL/uL (ref 3.80–5.10)
RDW: 14.7 % (ref 11.0–15.0)
WBC: 9 10*3/uL (ref 3.8–10.8)

## 2017-03-27 LAB — TSH: TSH: 5.21 mIU/L — ABNORMAL HIGH

## 2017-03-27 MED ORDER — IBUPROFEN 800 MG PO TABS: 800.0000 mg | ORAL_TABLET | Freq: Three times a day (TID) | ORAL | 1 refills | Status: DC | PRN

## 2017-03-27 NOTE — Addendum Note (Signed)
Addended by: Nelva Nay on: 03/27/2017 11:35 AM   Modules accepted: Orders

## 2017-03-27 NOTE — Patient Instructions (Signed)
Follow-up for ultrasound as scheduled.   Call to Schedule your mammogram  Facilities in [place]: 1)  [facility]. [facility], [address] Phone: [phone] 2)  Dr. Bertrand at [facility]  [address] Phone: [phone]     Mammogram A mammogram is an X-ray test to find changes in a woman's breast. You should get a mammogram if:  You are 52 years of age or older  You have risk factors.   Your doctor recommends that you have one.  BEFORE THE TEST  Do not schedule the test the week before your period, especially if your breasts are sore during this time.  On the day of your mammogram:  Wash your breasts and armpits well. After washing, do not put on any deodorant or talcum powder on until after your test.   Eat and drink as you usually do.   Take your medicines as usual.   If you are diabetic and take insulin, make sure you:   Eat before coming for your test.   Take your insulin as usual.   If you cannot keep your appointment, call before the appointment to cancel. Schedule another appointment.  TEST  You will need to undress from the waist up. You will put on a hospital gown.   Your breast will be put on the mammogram machine, and it will press firmly on your breast with a piece of plastic called a compression paddle. This will make your breast flatter so that the machine can X-ray all parts of your breast.   Both breasts will be X-rayed. Each breast will be X-rayed from above and from the side. An X-ray might need to be taken again if the picture is not good enough.   The mammogram will last about 15 to 30 minutes.  AFTER THE TEST Finding out the results of your test Ask when your test results will be ready. Make sure you get your test results.  Document Released: 01/25/2009 Document Revised: 10/18/2011 Document Reviewed: 01/25/2009 Exit[employer] Patient Information 2012 [employer].   

## 2017-03-27 NOTE — Progress Notes (Signed)
Kendra Castro [birth year] [id number]        51 y.o.  G0P0000 new patient who presents with a history of regular monthly menses through December and then no bleeding until last week when she had heavy bleeding with bleedthrough episode and a lot of cramping. Bleeding has now resolved. Is not sexually active. No significant hot flushes, night sweats. Reports having a Pap smear last year at an urgent care but has not had regular GYN exam/breast exam for some time. Saw Dr. Fuller Plan last fall with neck easier constipation and lower abdominal pain. Apparently he had recommended colonoscopy but she has not followed up for this yet. No history of significant GYN pathology or abnormal Pap smears in the past.  Past medical history,surgical history, problem list, medications, allergies, family history and social history were all reviewed and documented as reviewed in the EPIC chart.  ROS:  Performed with pertinent positives and negatives included in the history, assessment and plan.   Additional significant findings :  None   Exam: Caryn Bee assistant Vitals:   03/27/17 1005  BP: 134/82  Weight: 262 lb (118.8 kg)  Height: 5\' 5"  (1.651 m)   Body mass index is 43.6 kg/m.  General appearance:  Normal affect, orientation and appearance. Skin: Grossly normal HEENT: Without gross lesions.  No cervical or supraclavicular adenopathy. Thyroid normal.  Lungs:  Clear without wheezing, rales or rhonchi Cardiac: RR, without RMG Abdominal:  Soft, nontender, without masses, guarding, rebound, organomegaly or hernia Breasts:  Examined lying and sitting without masses, retractions, discharge or axillary adenopathy. Pelvic:  Ext, BUS, Vagina: Normal   Cervix: Normal high in the vaginal vault Pap smear done  Uterus: Difficult to palpate but no gross masses or tenderness  Adnexa: Without gross masses or tenderness    Anus and perineum: Normal   Rectovaginal: Normal sphincter tone without palpated masses or  tenderness.    Assessment/Plan:  51 y.o. G0P0000 female for annual exam with irregular menses 1, not sexually active.   1. Irregular menses. Patient reports having regular monthly menses through December then skipped until last week with heavy bleed and cramping. Now resolved. Exam is limited by abdominal girth. Recommend starting with pelvic ultrasound to rule out ovarian disease and look at the endometrial echo. Most likely represents perimenopausal irregular bleeding. We'll check baseline CBC, TSH and FSH. Will keep menstrual calendar for now and if significant irregular bleeding pursue a more involved evaluation. 2. Mammography many years ago. Recommend patient call and schedule mammogram now. Patient agrees to do so. Most common cancer in women was discussed. SBE monthly reviewed. 3. Pap smear reported last year at urgent care. Question whether really done. No results in the epic. Pap smear done today. No history of abnormal Pap smears by patient's history. 4. Colonoscopy never. Had seen Dr. Fuller Plan who had recommended colonoscopy but she has not scheduled this yet. I recommended she follow up with him in reference to this. 5. Health maintenance. Patient did have toasting glass of milk to smarting but I went ahead to check a lipid profile that she has no primary and has not had blood work done in some time. Also ordered CBC, CMP, hemoglobin A1c due to obesity, TSH, FSH. Recommended patient establish care with a primary physician for ongoing health maintenance. Blood pressure borderline at 134/82 in the need to continue to follow this also reviewed. Follow up for ultrasound and lab results.   Anastasio Auerbach MD, 10:33 AM 03/27/2017

## 2017-03-28 LAB — FOLLICLE STIMULATING HORMONE: FSH: 16.4 m[IU]/mL

## 2017-03-28 LAB — HEMOGLOBIN A1C
Hgb A1c MFr Bld: 5.9 % — ABNORMAL HIGH (ref <5.7)
MEAN PLASMA GLUCOSE: 123 mg/dL

## 2017-03-28 LAB — PAP IG W/ RFLX HPV ASCU

## 2017-05-29 ENCOUNTER — Other Ambulatory Visit: Payer: Self-pay | Admitting: Gynecology

## 2017-05-29 DIAGNOSIS — R7989 Other specified abnormal findings of blood chemistry: Secondary | ICD-10-CM

## 2017-07-18 ENCOUNTER — Encounter (HOSPITAL_COMMUNITY): Payer: Self-pay | Admitting: *Deleted

## 2017-07-18 ENCOUNTER — Emergency Department (HOSPITAL_COMMUNITY): Payer: Self-pay

## 2017-07-18 ENCOUNTER — Emergency Department (HOSPITAL_COMMUNITY)
Admission: EM | Admit: 2017-07-18 | Discharge: 2017-07-19 | Disposition: A | Discharge status: Home / Self Care | Payer: Self-pay | Attending: Emergency Medicine | Admitting: Emergency Medicine

## 2017-07-18 DIAGNOSIS — I10 Essential (primary) hypertension: Secondary | ICD-10-CM | POA: Insufficient documentation

## 2017-07-18 DIAGNOSIS — M25512 Pain in left shoulder: Secondary | ICD-10-CM | POA: Insufficient documentation

## 2017-07-18 NOTE — Discharge Instructions (Signed)
It was my pleasure taking care of you today!  Tylenol as needed for pain. Ice shoulder throughout the day (instructions below).   Call the orthopedist tomorrow morning to schedule a follow up appointment.  Return to the ER for new or worsening symptoms, any additional concerns.  COLD THERAPY DIRECTIONS:  Ice or gel packs can be used to reduce both pain and swelling. Ice is the most helpful within the first 24 to 48 hours after an injury or flareup from overusing a muscle or joint.  Ice is effective, has very few side effects, and is safe for most people to use.   If you expose your skin to cold temperatures for too long or without the proper protection, you can damage your skin or nerves. Watch for signs of skin damage due to cold.   HOME CARE INSTRUCTIONS  Follow these tips to use ice and cold packs safely.  Place a dry or damp towel between the ice and skin. A damp towel will cool the skin more quickly, so you may need to shorten the time that the ice is used.  For a more rapid response, add gentle compression to the ice.  Ice for no more than 10 to 20 minutes at a time. The bonier the area you are icing, the less time it will take to get the benefits of ice.  Check your skin after 5 minutes to make sure there are no signs of a poor response to cold or skin damage.  Rest 20 minutes or more in between uses.  Once your skin is numb, you can end your treatment. You can test numbness by very lightly touching your skin. The touch should be so light that you do not see the skin dimple from the pressure of your fingertip. When using ice, most people will feel these normal sensations in this order: cold, burning, aching, and numbness.

## 2017-07-18 NOTE — ED Provider Notes (Signed)
[facility] DEPT Provider Note   CSN: [id number] Arrival date & time: 07/18/17  2142     History   Chief Complaint Chief Complaint  Patient presents with  . Shoulder Pain    HPI Kendra Castro is a 51 y.o. female.  The history is provided by the patient and medical records. No language interpreter was used.  Shoulder Pain   Pertinent negatives include no numbness.   Kendra Castro is a 51 y.o. female who presents to the Emergency Department complaining of left sided shoulder pain for the last month. 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 Since starting this job, both shoulders and neck have been very sore, however left shoulder is bothering her the most. This is the side she typically uses to lift paint cains. Pain is worse with lifting her arm over her head or with palpation. Hx of rotator cuff injury in 2013 and seen by orthopedics. She states pain to the left shoulder today feels similar.  Past Medical History:  Diagnosis Date  . Constipation   . Hypertension   . Obesity     Patient Active Problem List   Diagnosis Date Noted  . Near syncope 09/15/2015  . Hypokalemia due to loss of potassium 09/15/2015  . Abnormal ECG 09/15/2015  . Precordial chest pain 09/15/2015  . Benign essential HTN 09/15/2015  . Hyperglycemia 09/15/2015    Past Surgical History:  Procedure Laterality Date  . CYST EXCISION     Lower back    OB History    Gravida Para Term Preterm AB Living   0 0 0 0 0 0   SAB TAB Ectopic Multiple Live Births   0 0 0 0 0       Home Medications    Prior to Admission medications   Medication Sig Start Date End Date Taking? Authorizing Provider  ibuprofen (ADVIL,MOTRIN) 800 MG tablet Take 1 tablet (800 mg total) by mouth every 8 (eight) hours as needed. 03/27/17   Fontaine, Belinda Block, MD    Family  History Family History  Problem Relation Age of Onset  . Hypertension Father   . Diabetes Father   . Hypertension Mother     Social History Social History  Substance Use Topics  . Smoking status: Never Smoker  . Smokeless tobacco: Never Used  . Alcohol use No     Allergies   Patient has no known allergies.   Review of Systems Review of Systems  Respiratory: Negative for shortness of breath.   Cardiovascular: Negative for chest pain.  Gastrointestinal: Negative for nausea.  Musculoskeletal: Positive for arthralgias.  Neurological: Negative for weakness and numbness.     Physical Exam Updated Vital Signs BP (!) 168/92   Pulse 64   Temp 97.7 F (36.5 C) (Oral)   Resp 18   LMP 03/17/2017   SpO2 99%   Physical Exam  Constitutional: She appears well-developed and well-nourished. No distress.  HENT:  Head: Normocephalic and atraumatic.  Neck: Neck supple.  No midline cervical tenderness. Full ROM without pain.  Cardiovascular: Normal rate, regular rhythm and normal heart sounds.   No murmur heard. Pulmonary/Chest: Effort normal and breath sounds normal. No respiratory distress. She has no wheezes. She has no rales.  Musculoskeletal:  Left shoulder with tenderness to palpation to rotator cuff musculature. + empty can test. Decreased ROM 2/2 pain. No  swelling, erythema or ecchymosis present. No step-off, crepitus, or deformity appreciated. 5/5 muscle strength of BUE. 2+ radial pulse, sensation intact and all compartments soft.  Neurological: She is alert.  Skin: Skin is warm and dry.  Nursing note and vitals reviewed.    ED Treatments / Results  Labs (all labs ordered are listed, but only abnormal results are displayed) Labs Reviewed - No data to display  EKG  EKG Interpretation None       Radiology Dg Shoulder Left  Result Date: 07/18/2017 CLINICAL DATA:  Pt c/o L shoulder pain for the past couple of weeks. Hx of rotator cuff injury in 2013. Recently  started a new job in which she continuously lifts paint cans. Pain worsens with movement EXAM: LEFT SHOULDER - 2+ VIEW COMPARISON:  None. FINDINGS: Left shoulder appears intact. Old fracture deformity of the left clavicle. Subacromial spur formation. No evidence of acute fracture or subluxation. No focal bone lesion or bone destruction. Bone cortex and trabecular architecture appear intact. No radiopaque soft tissue foreign bodies. IMPRESSION: No acute bony abnormalities. Subacromial spur formation is present. Old fracture deformity of left clavicle. Electronically Signed   By: Lucienne Capers M.D.   On: 07/18/2017 23:33    Procedures Procedures (including critical care time)  Medications Ordered in ED Medications - No data to display   Initial Impression / Assessment and Plan / ED Course  I have reviewed the triage vital signs and the nursing notes.  Pertinent labs & imaging results that were available during my care of the patient were reviewed by me and considered in my medical decision making (see chart for details).    Kendra Castro is a 51 y.o. female who presents to ED for left shoulder pain x 3 weeks after starting a new job requiring heavy lifting. Hx of rotator cuff injury several years ago and this feels similar. Patient is tender to palpation along the rotator cuff musculature and does have positive empty can test. No numbness or tingling. Neurovascularly intact. X-ray with no acute findings. With the follow-up strongly encouraged. Patient states that she has seen orthopedist back in 2013, but cannot remember clinic or physician name. Will give orthopedic referral information. Symptomatic home care discussed. All questions answered.  Final Clinical Impressions(s) / ED Diagnoses   Final diagnoses:  Acute pain of left shoulder    New Prescriptions New Prescriptions   No medications on file     Jasalyn Frysinger, Ozella Almond, PA-C 07/18/17 2346    Duffy Bruce, MD 07/19/17  [phone]

## 2017-07-18 NOTE — ED Triage Notes (Signed)
Pt c/o L shoulder pain for the past couple of weeks. Hx of rotator cuff injury in 2013. 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 Pain worsens with movement

## 2017-12-04 ENCOUNTER — Encounter: Payer: Self-pay | Admitting: Student in an Organized Health Care Education/Training Program

## 2017-12-04 ENCOUNTER — Ambulatory Visit (INDEPENDENT_AMBULATORY_CARE_PROVIDER_SITE_OTHER): Payer: BLUE CROSS/BLUE SHIELD | Admitting: Student in an Organized Health Care Education/Training Program

## 2017-12-04 ENCOUNTER — Other Ambulatory Visit: Payer: Self-pay

## 2017-12-04 VITALS — BP 170/88 | HR 64 | Temp 98.0°F | Ht 65.0 in | Wt 263.8 lb

## 2017-12-04 DIAGNOSIS — R7303 Prediabetes: Secondary | ICD-10-CM | POA: Diagnosis not present

## 2017-12-04 DIAGNOSIS — R030 Elevated blood-pressure reading, without diagnosis of hypertension: Secondary | ICD-10-CM

## 2017-12-04 DIAGNOSIS — R221 Localized swelling, mass and lump, neck: Secondary | ICD-10-CM

## 2017-12-04 HISTORY — DX: Prediabetes: R73.03

## 2017-12-04 NOTE — Progress Notes (Signed)
CC: Establish care  HPI: Kendra Castro is a 52 y.o. female with PMH significant for HTN not on medications who presents to [facility] today to establish care as a new patient.   Throat swelling - started recently over the last month - comes on after she eats something sugary - she has the sensation that she must drink lots of water after it starts - resolves on its own - no difficulty getting air in, no sensation that throat is swelling - no associated symptoms like rhinorrhea, rash, congestion - no sensation that food gets stuck in her throat - Patient does have a history of prediabetes when checked in 03/2017, and in addition to these episodes of polydipsia she does endorse intermittent polyuria without dysuria  HTN - Patient has not been treated for HTN for "a while" - sometimes checks her blood pressure at the pharmacy and finds that it is elevated >009 systolic - she is not endorsing headaches, blurred vision or chest pain  PMH: 1. Allergies, allergy to bamboo 2. Arthritis 3. HTN - previously prescribed HCTZ several years ago, has not taken for a while  PSH: - None  Family Hx: - Mother with Cancer, mood disorder - Father with heart disease, Diabetes, HTN, thyroid disease  Social Hx: - Lives at home with her elderly father - Designates her father to make medical decisions if she were unable to - does not exercise regularly - Denies tobacco, drug, alcohol use  Drug allergies: None  Meds: - none  Overdue Health Maintenance - 2017 last PAP - 2009 Heart stress test - Due for flu vaccine, TDAP, mammogram, colonoscopy  Review of Symptoms:  See HPI for ROS.   CC, SH/smoking status, and VS noted.  Objective: BP (!) 170/88   Pulse 64   Temp 98 F (36.7 C) (Oral)   Ht 5\' 5"  (1.651 m)   Wt 263 lb 12.8 oz (119.7 kg)   LMP 03/17/2017   SpO2 98%   BMI 43.90 kg/m  GEN: NAD, alert, cooperative, and pleasant. EYE: no conjunctival injection, pupils equally round and  reactive to light ENMT: normal tympanic light reflex, no nasal polyps,no rhinorrhea, no pharyngeal erythema or exudates NECK: full ROM, no thyromegaly RESPIRATORY: clear to auscultation bilaterally with no wheezes, rhonchi or rales, good effort CV: RRR, no m/r/g, no peripheral edema GI: soft, non-tender, non-distended, no hepatosplenomegaly SKIN: warm and dry, no rashes or lesions NEURO: II-XII grossly intact, normal gait, peripheral sensation intact PSYCH: AAOx3, appropriate affect  Assessment and plan:  Elevated blood pressure reading Elevated blood pressure reading x2 in the office today. No red flag symptoms - follow up in 1 week to recheck pressure - plan to start HCTZ at that time if pressure remains elevated on recheck   Prediabetes 03/2017 A1c recorded as 5.9, prediabetes. Given history of "throat swelling" leading to polydipsia as well as intermittent polyuria, would be reasonable to recheck A1c today. - A1c machine not working, so have patient A1c checked at follow up in 1 week. - if A1c is elevated, could go ahead and check BMP and lipid panel at that time  Throat swelling Uncertain etiology. The sensation associated with polydipsia leads me to think we need to recheck an A1c (see above) as this has been prediabetic range in the past. No red flag symptoms for food intolerance, and no true dysphagia symptoms.  - Asked patient to keep a diary when throat swelling sensation occurs so we can see if there is a relation  between the type of food and the onset of symptoms, which may suggest a food allergy or intolerance   Orders Placed This Encounter  Procedures  . Hemoglobin A1c   Everrett Coombe, MD,MS,  PGY2 12/04/2017 9:10 AM

## 2017-12-04 NOTE — Assessment & Plan Note (Signed)
03/2017 A1c recorded as 5.9, prediabetes. Given history of "throat swelling" leading to polydipsia as well as intermittent polyuria, would be reasonable to recheck A1c today. - A1c machine not working, so have patient A1c checked at follow up in 1 week. - if A1c is elevated, could go ahead and check BMP and lipid panel at that time

## 2017-12-04 NOTE — Assessment & Plan Note (Signed)
Uncertain etiology. The sensation associated with polydipsia leads me to think we need to recheck an A1c (see above) as this has been prediabetic range in the past. No red flag symptoms for food intolerance, and no true dysphagia symptoms.  - Asked patient to keep a diary when throat swelling sensation occurs so we can see if there is a relation between the type of food and the onset of symptoms, which may suggest a food allergy or intolerance

## 2017-12-04 NOTE — Assessment & Plan Note (Addendum)
Elevated blood pressure reading x2 in the office today. No red flag symptoms - follow up in 1 week to recheck pressure - plan to start HCTZ at that time if pressure remains elevated on recheck

## 2017-12-04 NOTE — Patient Instructions (Signed)
It was a pleasure seeing you today in our clinic. Today we discussed healthcare maintenance. Here is the treatment plan we have discussed and agreed upon together:  We drew blood work at today's visit. I will call or send you a letter with these results. If you do not hear from me within the next week, please give our office a call.  Our clinic's number is [phone]. Please call with questions or concerns about what we discussed today.  Be well, Dr. Burr Medico

## 2017-12-05 ENCOUNTER — Telehealth: Payer: Self-pay | Admitting: Student in an Organized Health Care Education/Training Program

## 2017-12-05 NOTE — Telephone Encounter (Signed)
Pt was seen as a new patient here yesterday. Pt started her job today and its a 12 hour job each day and 3 hours into her shirt she had a dizzy spell and now her job is looking at her as a health risk. Pt would like to know what to do about this. You can leave a message on the number in her chart when you call.

## 2017-12-06 NOTE — Telephone Encounter (Signed)
LVM to return call on Monday morning. Katharina Caper, April D, [place]

## 2017-12-06 NOTE — Telephone Encounter (Signed)
If she is having dizzy spells she needs an appointment to be seen and evaluated.

## 2017-12-09 NOTE — Telephone Encounter (Signed)
Patient left message on nurse line that she was returning April's call while on her break and she will try to call back at lunch time. Patient needs OV for dizziness.

## 2017-12-10 ENCOUNTER — Telehealth: Payer: Self-pay

## 2017-12-10 NOTE — Telephone Encounter (Signed)
Patient left message on nurse line wanting blood glucose results from last week. Upon reviewing chart this test was not performed because A1c machine was not working and will be checked at follow up on 12/16/17. Left message informing patient of this information. Danley Danker, RN [facility] RN)

## 2017-12-11 NOTE — Telephone Encounter (Signed)
Patient left another message on nurse line asking for A1c results. Called patient back and got voicemail and left a second message that test was not performed due to machine malfunction and will be done at next OV. Danley Danker, RN [facility] RN)

## 2017-12-12 NOTE — Telephone Encounter (Signed)
[facility] appt 12/16/17. Fleeger, Salome Spotted, CMA

## 2017-12-16 ENCOUNTER — Ambulatory Visit: Payer: Self-pay | Admitting: Student in an Organized Health Care Education/Training Program

## 2018-01-03 ENCOUNTER — Ambulatory Visit: Payer: BLUE CROSS/BLUE SHIELD | Admitting: Student in an Organized Health Care Education/Training Program

## 2018-01-03 ENCOUNTER — Other Ambulatory Visit: Payer: Self-pay

## 2018-01-03 ENCOUNTER — Encounter: Payer: Self-pay | Admitting: Student in an Organized Health Care Education/Training Program

## 2018-01-03 VITALS — BP 148/86 | HR 61 | Temp 97.9°F | Ht 65.0 in | Wt 260.0 lb

## 2018-01-03 DIAGNOSIS — R1031 Right lower quadrant pain: Secondary | ICD-10-CM | POA: Diagnosis not present

## 2018-01-03 DIAGNOSIS — R739 Hyperglycemia, unspecified: Secondary | ICD-10-CM | POA: Diagnosis not present

## 2018-01-03 DIAGNOSIS — I10 Essential (primary) hypertension: Secondary | ICD-10-CM | POA: Diagnosis not present

## 2018-01-03 DIAGNOSIS — M79602 Pain in left arm: Secondary | ICD-10-CM

## 2018-01-03 DIAGNOSIS — R7303 Prediabetes: Secondary | ICD-10-CM | POA: Diagnosis not present

## 2018-01-03 LAB — POCT GLYCOSYLATED HEMOGLOBIN (HGB A1C): Hemoglobin A1C: 6

## 2018-01-03 MED ORDER — AMLODIPINE BESYLATE 10 MG PO TABS: 10.0000 mg | ORAL_TABLET | Freq: Every day | ORAL | 3 refills | Status: DC

## 2018-01-03 NOTE — Patient Instructions (Addendum)
It was a pleasure seeing you today in our clinic. Today we discussed your pain and blood pressure. Here is the treatment plan we have discussed and agreed upon together:  Start the new medication, Amlodipine each day  Follow up for a nurse visit in one week to have your blood pressure checked.  The ultrasound was ordered.  Please get a colonscopy.  Our clinic's number is [phone]. Please call with questions or concerns about what we discussed today.  Be well, Dr. Burr Medico

## 2018-01-03 NOTE — Progress Notes (Signed)
CC: RLQ pain  HPI: Kendra Castro is a 52 y.o. female   Pinching pain in arm, left - started this year - worse this week - lasts 2 minutes - resolves on its own - 3/10, nonradiating, has not taken anything for it - non radiating - feels may be related to soda or eating something greasy  RLQ pain - First noticed 03/2017, was recommended pelvic U/S by another physician however she did not have this done - Crampy, non-radiating RLQ pain lasts 5-10 minutes and resolves on its own - worse when she bends over - occurs ~2 times per month - not related to BM, no constipation or diarrhea - menopause last menses 09/2017  - no hematochezia or melena - no vaginal bleeding - no vaginal discharge, pruritis, dyspareunia. No Urinary frequency/urgency/dysuria  HTN 2 elevated reads of HTN, today and previous visit. No HA, blurred vision, CP, palpitations. Agreeable to starting medication.  Prediabetes - HbA1c prediabetic range at 5.9 on last check in 03/2018, rechecked today and found A1c to be 6.0 - patient agrees to try diet and exercise interventions  Review of Symptoms:  See HPI for ROS.   CC, SH/smoking status, and VS noted.  Objective: BP (!) 148/86   Pulse 61   Temp 97.9 F (36.6 C) (Oral)   Ht 5\' 5"  (1.651 m)   Wt 260 lb (117.9 kg)   LMP 03/17/2017   SpO2 99%   BMI 43.27 kg/m  GEN: NAD, alert, cooperative, and pleasant. RESPIRATORY: clear to auscultation bilaterally with no wheezes, rhonchi or rales, good effort CV: RRR, no m/r/g, no perpheral edemai GI: soft, non-distended, +mild RLQ pain to deep palpation GU: no cervical motion tenderness, no adnexal masses appreciated Upper extremities: L arm - no redness, edema, masses. No tenderness to palpation in upper arm. Distal pulses and sensation intact. 5/5 muscle strength in all RUE and LUE muscle groups. No cervical spine tenderness. SKIN: warm and dry, no rashes or lesions NEURO: II-XII grossly intact PSYCH: AAOx3,  appropriate affect  Assessment and plan:  Essential hypertension No red flag s/s. Two elevated reads 148/66 today, also elevated at previous office visit.  - start norvasc 10 mg daily - follow up 2 weeks for nurse check BP - encouraged checking BP at home - pt to call office if she develops light-headedness/dizziness as this would indicate drop in pressures  RLQ abdominal pain Uncertain etiology, considering GI vs. GU etiology. No infectious symptoms. May be IBS but have to rule out more serious causes first.  - colonoscopy forms provided - GU exam benign - ordered pelvic U/S to rule out mass or (less likely) torsion - follow up 1 month or sooner if symptoms worsen/become persistent  Left arm pain Uncertain etiology. Pain is intermittent, does not persist, and there are no findings on exam (not even ttp). No evidence to support injury, DVT, or infection at this time. Patient to keep diary of symptoms and bring to next visit for more information.  Prediabetes Briefly discussed exercise and diet interventions - will need to continue to counsel at future visits. Recheck A1c in 6 months. (~07/2018)   Orders Placed This Encounter  Procedures  . US PELVIC COMPLETE WITH TRANSVAGINAL    Standing Status:   Future    Standing Expiration Date:   03/04/2019    Order Specific Question:   Reason for Exam (SYMPTOM  OR DIAGNOSIS REQUIRED)    Answer:   RLQ pain    Order Specific Question:  Preferred imaging location?    Answer:   [facility]  . HgB A1c    Meds ordered this encounter  Medications  . amLODipine (NORVASC) 10 MG tablet    Sig: Take 1 tablet (10 mg total) by mouth daily.    Dispense:  90 tablet    Refill:  3    Everrett Coombe, MD,MS,  PGY2 01/05/2018 4:58 PM

## 2018-01-05 ENCOUNTER — Encounter: Payer: Self-pay | Admitting: Student in an Organized Health Care Education/Training Program

## 2018-01-05 DIAGNOSIS — R1031 Right lower quadrant pain: Secondary | ICD-10-CM

## 2018-01-05 DIAGNOSIS — S43429A Sprain of unspecified rotator cuff capsule, initial encounter: Secondary | ICD-10-CM | POA: Insufficient documentation

## 2018-01-05 HISTORY — DX: Right lower quadrant pain: R10.31

## 2018-01-05 HISTORY — DX: Sprain of unspecified rotator cuff capsule, initial encounter: S43.429A

## 2018-01-05 NOTE — Assessment & Plan Note (Signed)
Uncertain etiology, considering GI vs. GU etiology. No infectious symptoms. May be IBS but have to rule out more serious causes first.  - colonoscopy forms provided - GU exam benign - ordered pelvic U/S to rule out mass or (less likely) torsion - follow up 1 month or sooner if symptoms worsen/become persistent

## 2018-01-05 NOTE — Assessment & Plan Note (Signed)
Briefly discussed exercise and diet interventions - will need to continue to counsel at future visits. Recheck A1c in 6 months. (~07/2018)

## 2018-01-05 NOTE — Assessment & Plan Note (Signed)
Uncertain etiology. Pain is intermittent, does not persist, and there are no findings on exam (not even ttp). No evidence to support injury, DVT, or infection at this time. Patient to keep diary of symptoms and bring to next visit for more information.

## 2018-01-05 NOTE — Assessment & Plan Note (Signed)
No red flag s/s. Two elevated reads 148/66 today, also elevated at previous office visit.  - start norvasc 10 mg daily - follow up 2 weeks for nurse check BP - encouraged checking BP at home - pt to call office if she develops light-headedness/dizziness as this would indicate drop in pressures

## 2018-01-07 ENCOUNTER — Telehealth: Payer: Self-pay | Admitting: Student in an Organized Health Care Education/Training Program

## 2018-01-07 NOTE — Telephone Encounter (Signed)
Centralized Scheduling: needs an order for ultrasound pelvis complete with transvaginal.

## 2018-01-07 NOTE — Telephone Encounter (Signed)
LVM for peggy to call office back to inform her that the order is already in for this. Kendra Castro, Kendra Castro, Kendra Castro

## 2018-01-09 ENCOUNTER — Ambulatory Visit (HOSPITAL_COMMUNITY): Payer: BLUE CROSS/BLUE SHIELD

## 2018-01-14 ENCOUNTER — Ambulatory Visit
Admission: RE | Admit: 2018-01-14 | Discharge: 2018-01-14 | Disposition: A | Discharge status: Home / Self Care | Payer: BLUE CROSS/BLUE SHIELD | Source: Ambulatory Visit | Attending: Family Medicine | Admitting: Family Medicine

## 2018-01-14 DIAGNOSIS — R1031 Right lower quadrant pain: Secondary | ICD-10-CM

## 2018-01-14 DIAGNOSIS — D259 Leiomyoma of uterus, unspecified: Secondary | ICD-10-CM | POA: Diagnosis not present

## 2018-01-17 ENCOUNTER — Telehealth: Payer: Self-pay | Admitting: Student in an Organized Health Care Education/Training Program

## 2018-01-17 NOTE — Telephone Encounter (Signed)
Patient still has not received results from ultrasound this Tuesday.  Please call her asap at [phone].  Thank you.

## 2018-01-21 ENCOUNTER — Other Ambulatory Visit: Payer: Self-pay | Admitting: Student in an Organized Health Care Education/Training Program

## 2018-01-22 ENCOUNTER — Encounter: Payer: Self-pay | Admitting: Student in an Organized Health Care Education/Training Program

## 2018-01-22 NOTE — Telephone Encounter (Signed)
Called patient, LVM that we have her test results. I will send her a letter indicating that her ultrasound was normal. There was a 2.8 cm fibroid in her uterus which is too small to explain her pain. Would recommend that she proceeds with colonoscopy and return to care if RLQ pain persists or does not resolve.

## 2018-01-30 ENCOUNTER — Ambulatory Visit: Payer: Self-pay | Admitting: Nurse Practitioner

## 2018-02-21 ENCOUNTER — Encounter: Payer: Self-pay | Admitting: Family Medicine

## 2018-02-21 ENCOUNTER — Other Ambulatory Visit: Payer: Self-pay

## 2018-02-21 ENCOUNTER — Encounter: Payer: Self-pay | Admitting: Licensed Clinical Social Worker

## 2018-02-21 ENCOUNTER — Ambulatory Visit (HOSPITAL_COMMUNITY)
Admission: RE | Admit: 2018-02-21 | Discharge: 2018-02-21 | Disposition: A | Discharge status: Home / Self Care | Payer: BLUE CROSS/BLUE SHIELD | Source: Ambulatory Visit | Attending: Family Medicine | Admitting: Family Medicine

## 2018-02-21 ENCOUNTER — Ambulatory Visit: Payer: BLUE CROSS/BLUE SHIELD | Admitting: Family Medicine

## 2018-02-21 VITALS — BP 128/78 | HR 76 | Temp 98.7°F | Ht 65.0 in | Wt 263.0 lb

## 2018-02-21 DIAGNOSIS — F419 Anxiety disorder, unspecified: Secondary | ICD-10-CM | POA: Diagnosis not present

## 2018-02-21 DIAGNOSIS — R079 Chest pain, unspecified: Secondary | ICD-10-CM | POA: Diagnosis not present

## 2018-02-21 DIAGNOSIS — M542 Cervicalgia: Secondary | ICD-10-CM

## 2018-02-21 NOTE — Patient Instructions (Addendum)
It was a pleasure to see you today! Thank you for choosing [facility] for your primary care. Kendra Castro was seen for neck pain. Come back to the clinic if your pain continues to get worse, and go to the emergency room if you have any life threatening symptoms.  Given your explanation and physical exam the most likely cause is stress.  We'd like to suggest connecting with the behavioral health team to cope with stress and some neck stretches.  If we did any lab work today, and the results require attention, either me or my nurse will get in touch with you. If everything is normal, you will get a letter in mail and a message via . If you don't hear from [place] in two weeks, please give [place] a call. Otherwise, we look forward to seeing you again at your next visit. If you have any questions or concerns before then, please call the clinic at [phone].  Please bring all your medications to every doctors visit  Sign up for My Chart to have easy access to your labs results, and communication with your Primary care physician.    Please check-out at the front desk before leaving the clinic.    Best,  Dr. Sherene Sires FAMILY MEDICINE RESIDENT - PGY1 02/21/2018 4:33 PM

## 2018-02-21 NOTE — Progress Notes (Signed)
Type of Service: [place] is a 52 y.o. female referred by Dr. Criss Rosales for assistance with managing stressors Patient reports :concerns with psychosocial stressors and having difficulty managing them.  Issues discussed:  Integrated care services,  previous and current coping skills, demonstration of relaxed breathing.    Intervention: Reflective listening, supportive counseling, g Psychoeducation ; Mindfulness or Relaxation Training Assssment/Plan:.Patient appreciative of interventions discussed, she may benefit from further assessment and brief therapeutic intervention, however she did not committee to follow up at this time.  Casimer Lanius, LCSW Licensed Clinical Social Worker [place]   [phone] 4:46 PM

## 2018-02-21 NOTE — Progress Notes (Signed)
ROI completed. Original placed in to be scanned pile and copy placed in to be faxed pile for [place]. Mikel Hardgrove, Salome Spotted, CMA

## 2018-02-24 DIAGNOSIS — F419 Anxiety disorder, unspecified: Secondary | ICD-10-CM | POA: Insufficient documentation

## 2018-02-24 DIAGNOSIS — M542 Cervicalgia: Secondary | ICD-10-CM

## 2018-02-24 DIAGNOSIS — R0789 Other chest pain: Secondary | ICD-10-CM | POA: Insufficient documentation

## 2018-02-24 DIAGNOSIS — R079 Chest pain, unspecified: Secondary | ICD-10-CM | POA: Insufficient documentation

## 2018-02-24 HISTORY — DX: Other chest pain: R07.89

## 2018-02-24 HISTORY — DX: Anxiety disorder, unspecified: F41.9

## 2018-02-24 HISTORY — DX: Cervicalgia: M54.2

## 2018-02-24 NOTE — Progress Notes (Signed)
    Subjective:  Kendra Castro is a 52 y.o. female who presents to the [facility] today with a chief complaint of neck pain.   Neck pain at base of neck bilaterally.  Has been going on a few weeks and usually timed with arguments with ex roommate who has been asking for money.   IT's the major stressor in her life at this time.  When asked about chest pain claim in triage she says it's reallly more in her neck.  She had a few episodes over the last few weeks were she thought there was seom 6/10 chest pain for a few seconds when she was really upset but her largest problem has been 9/10 neck pain.  No arm radiation, no pleuritic pain,, no SOB, no LOC.   Objective:  Physical Exam: BP 128/78   Pulse 76   Temp 98.7 F (37.1 C) (Oral)   Ht 5\' 5"  (1.651 m)   Wt 263 lb (119.3 kg)   LMP 03/17/2017   SpO2 98%   BMI 43.77 kg/m   Gen: NAD, resting comfortably but appears anxious particularly when discussing ex roommmate CV: RRR with no murmurs appreciated Pulm: NWOB, CTAB with no crackles, wheezes, or rhonchi GI:  Soft, Nontender, Nondistended. MSK: no edema, cyanosis, or clubbing noted.  Very tight paraspinal muscles in base of neck bilaterally, very tender to deep palpation. No skin changes/obvious trauma Skin: warm, dry, acanthosis nigricans in neck Neuro: grossly normal, moves all extremities.  CN nerves grossly intact Psych: Normal affect and thought content  No results found for this or any previous visit (from the past 72 hour(s)).   Assessment/Plan:  Anxiety-like symptoms Major life stressors right now revolve around ex-roommate asking for money. Her neck pain is worse when "dealing with them"  Warm handoff to [facility] to discuss stress reduction techniques  Neck pain No neuro symptoms, no midline bony tnederness, no traumas noted, no skin changes.  Cervical neck is very forward in position and musculature around base of cervical neck is extremely tight and painful to deep pressure.  We  discussed stretches, posture, and are doing aa warm handoff to [facility] for stress reduction techniques  Chest pain Patient had said chest pain in triage but main complaint is actually neck pain.  ECG had been pulled by nurse and was neg for STEMI.   Patient with no current chest pain, not anginal indescription, not pleuritic  Little likelihood of anything cardia but return precautions were discussed   Sherene Sires, [place] - PGY1 02/24/2018 9:33 AM

## 2018-02-24 NOTE — Assessment & Plan Note (Signed)
No neuro symptoms, no midline bony tnederness, no traumas noted, no skin changes.  Cervical neck is very forward in position and musculature around base of cervical neck is extremely tight and painful to deep pressure.  We discussed stretches, posture, and are doing aa warm handoff to [facility] for stress reduction techniques

## 2018-02-24 NOTE — Assessment & Plan Note (Signed)
Patient had said chest pain in triage but main complaint is actually neck pain.  ECG had been pulled by nurse and was neg for STEMI.   Patient with no current chest pain, not anginal indescription, not pleuritic  Little likelihood of anything cardia but return precautions were discussed
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
## 2018-03-04 DIAGNOSIS — Z1211 Encounter for screening for malignant neoplasm of colon: Secondary | ICD-10-CM | POA: Diagnosis not present

## 2018-03-04 DIAGNOSIS — R1031 Right lower quadrant pain: Secondary | ICD-10-CM | POA: Diagnosis not present

## 2018-03-04 DIAGNOSIS — K5900 Constipation, unspecified: Secondary | ICD-10-CM | POA: Diagnosis not present

## 2018-03-04 DIAGNOSIS — K625 Hemorrhage of anus and rectum: Secondary | ICD-10-CM | POA: Diagnosis not present

## 2018-03-25 ENCOUNTER — Encounter: Payer: BLUE CROSS/BLUE SHIELD | Admitting: Gynecology

## 2018-04-17 ENCOUNTER — Encounter: Payer: Self-pay | Admitting: Student in an Organized Health Care Education/Training Program

## 2018-04-24 ENCOUNTER — Telehealth: Payer: Self-pay | Admitting: Family Medicine

## 2018-04-24 ENCOUNTER — Encounter: Payer: Self-pay | Admitting: Student in an Organized Health Care Education/Training Program

## 2018-04-24 NOTE — Telephone Encounter (Signed)
LVM for pt to call back to see about getting her an appointment scheduled. If she calls back please assist her in getting this scheduled. Katharina Caper, Cassiopeia Florentino D, [place]

## 2018-04-24 NOTE — Telephone Encounter (Signed)
White team, could you please call patient? I am covering Dr. Ernestina Penna inbox, and the patient sent a message about being dizzy with lower extremity swelling. She needs to be seen by someone this week if this is the case. Please help her make an appt.

## 2018-05-08 NOTE — Telephone Encounter (Signed)
LVM to call office back to see if she is still having problems and if so to assist her in getting an appointment scheduled. Katharina Caper, Talbot Monarch D, [place]

## 2018-11-08 IMAGING — US US PELVIS COMPLETE TRANSABD/TRANSVAG
1 series · 14 of 25 positions shown · non-contrast
Comparison: None

CLINICAL DATA: Right lower quadrant pain.

EXAM:
TRANSABDOMINAL AND TRANSVAGINAL ULTRASOUND OF PELVIS
TECHNIQUE: Both transabdominal and transvaginal ultrasound examinations of the
pelvis were performed. Transabdominal technique was performed for
global imaging of the pelvis including uterus, ovaries, adnexal
regions, and pelvic cul-de-sac. It was necessary to proceed with
endovaginal exam following the transabdominal exam to visualize the
uterus, ovaries, and adnexa.

[Series 1: us pelvis complete transabd/transvag · 0.31mm/px · 14 of 39 slices shown]
[im 1/39]
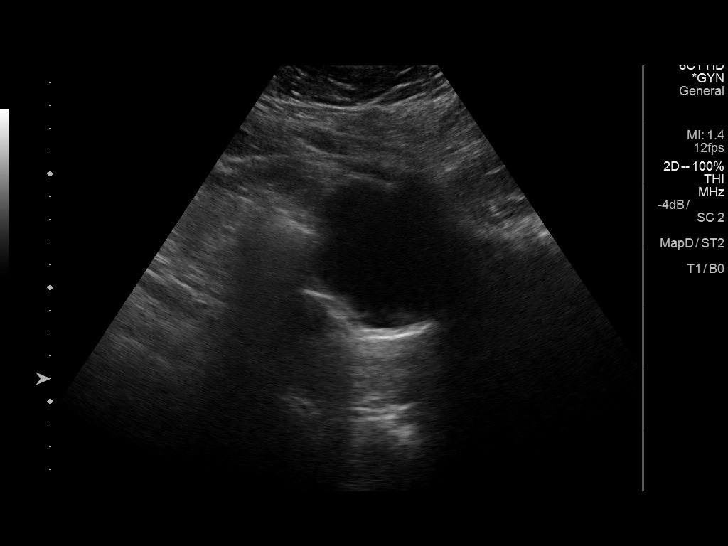
[im 4/39]
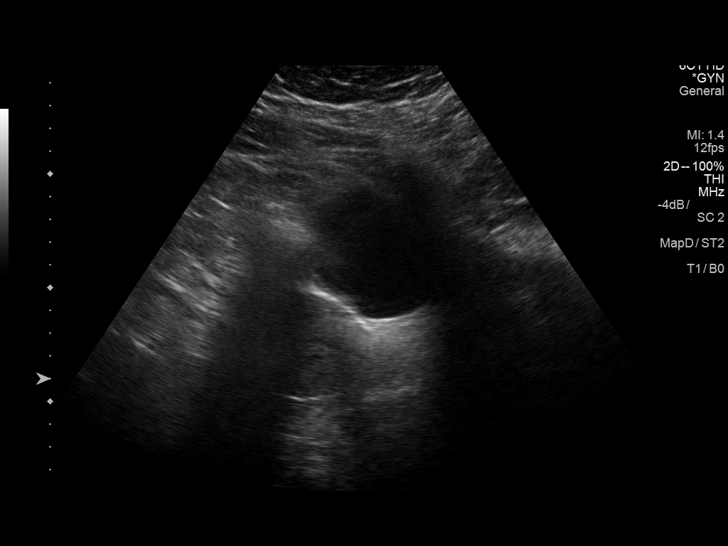
[im 7/39]
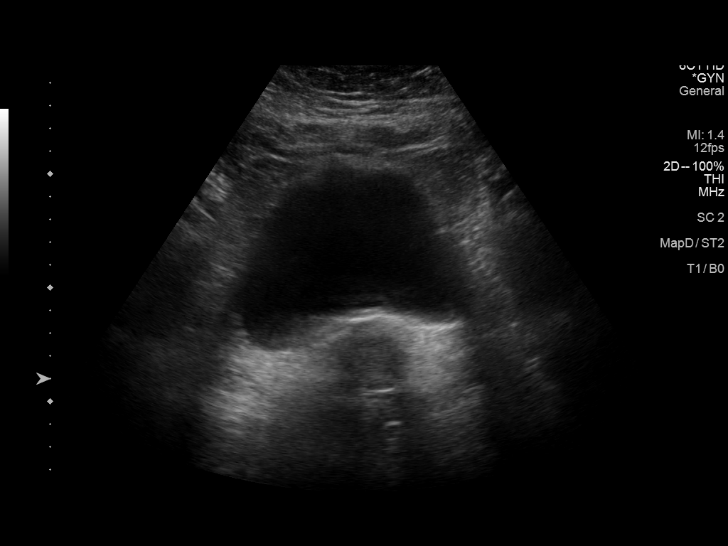
[im 10/39]
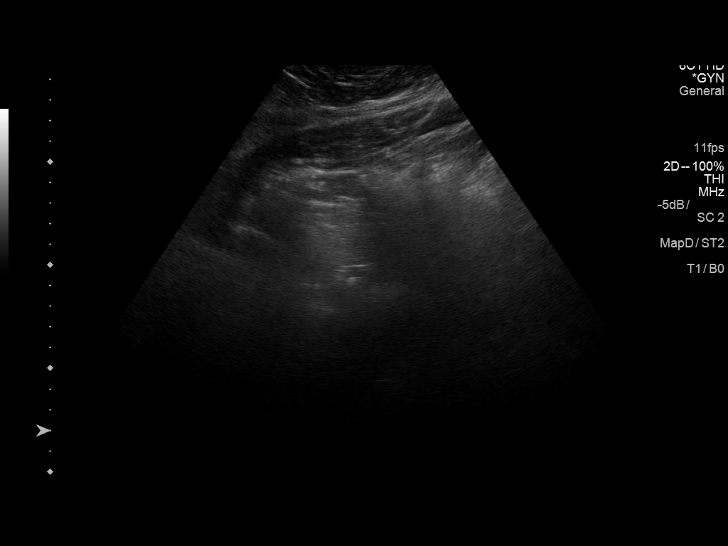
[im 13/39]
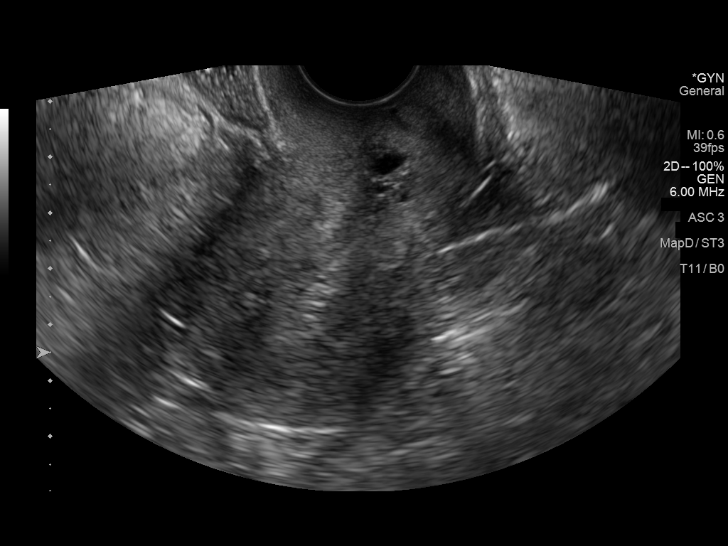
[im 15/39]
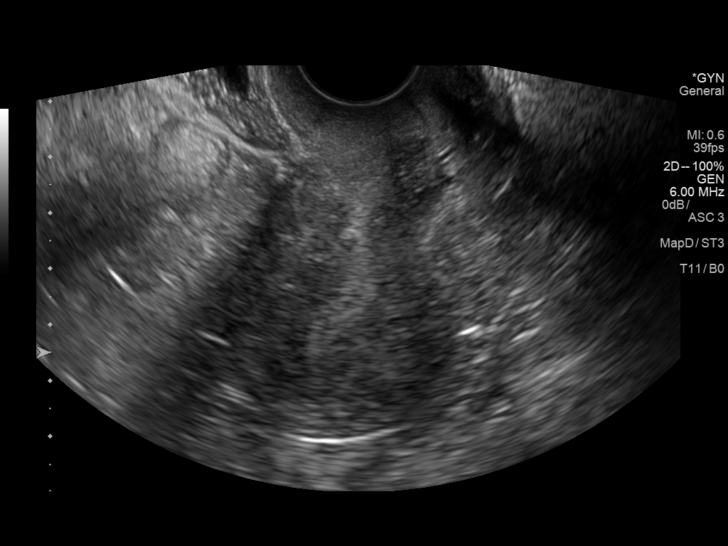
[im 18/39]
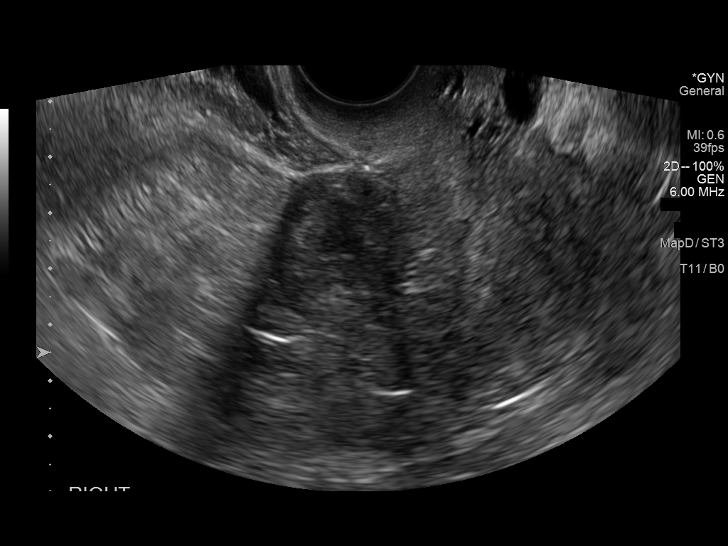
[im 21/39]
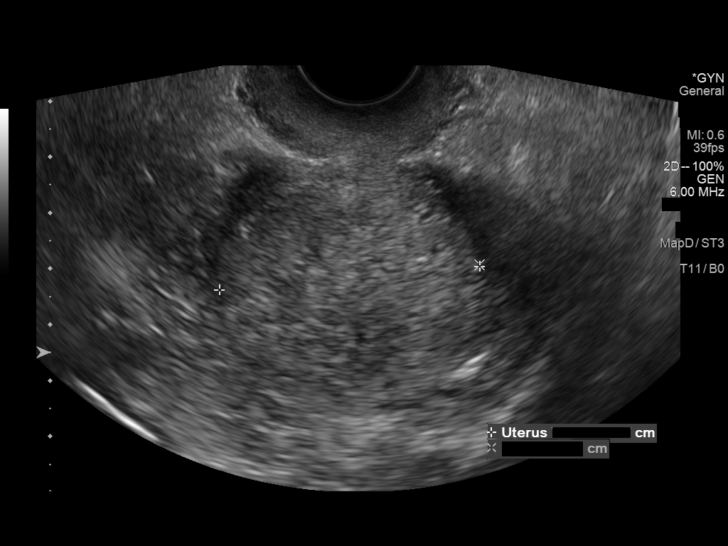
[im 24/39]
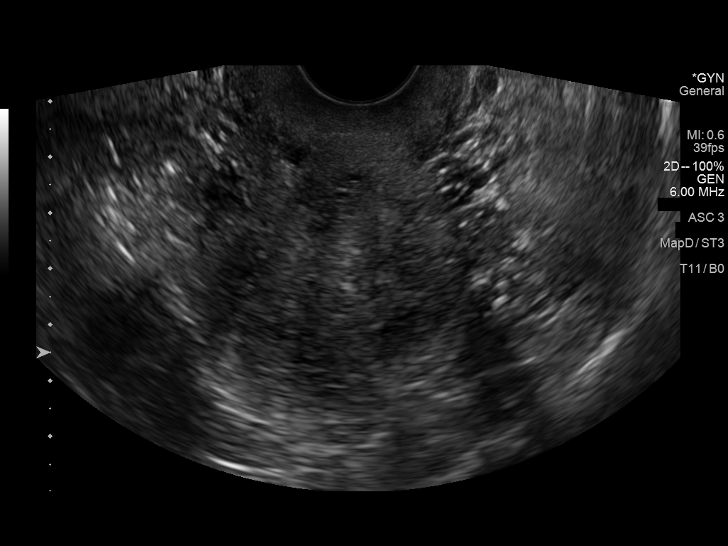
[im 26/39]
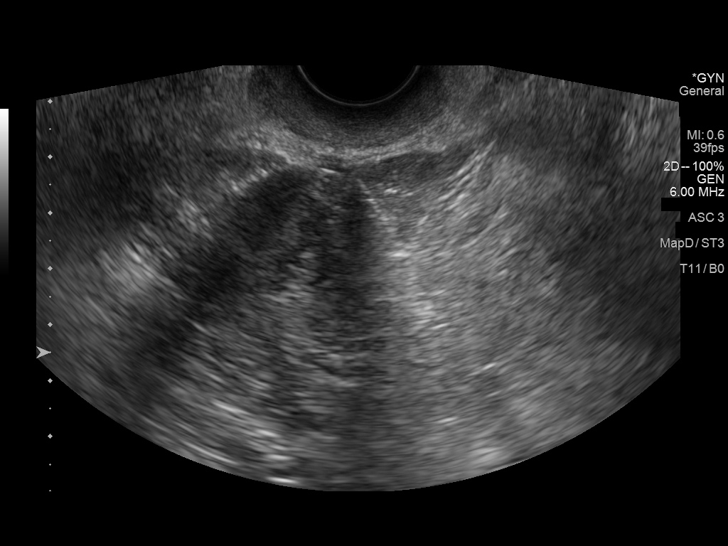
[im 29/39]
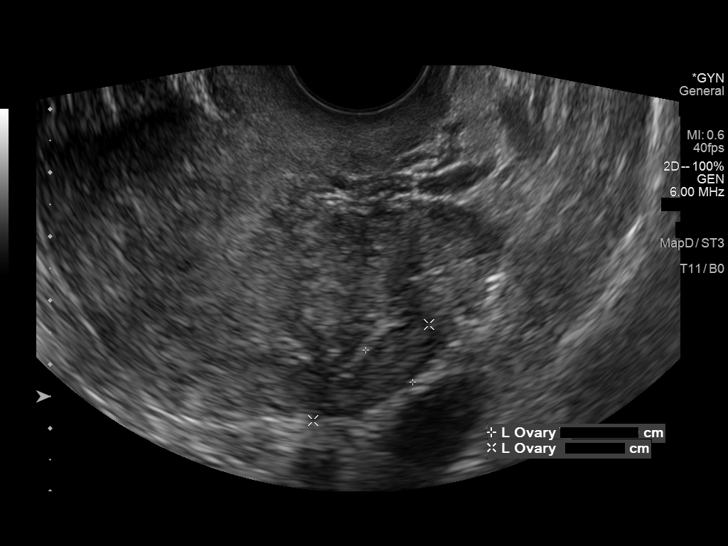
[im 32/39]
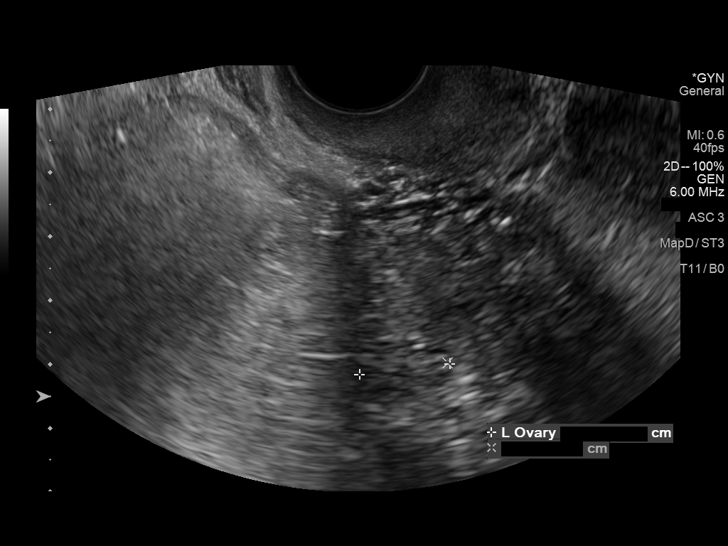
[im 35/39]
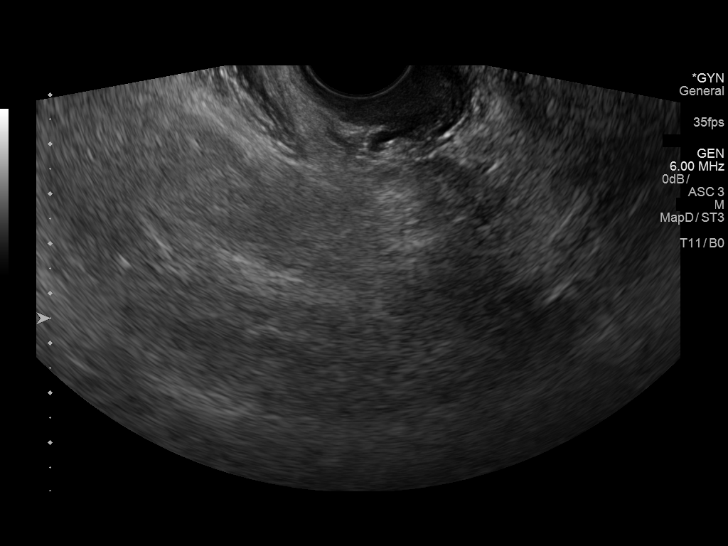
[im 39/39]
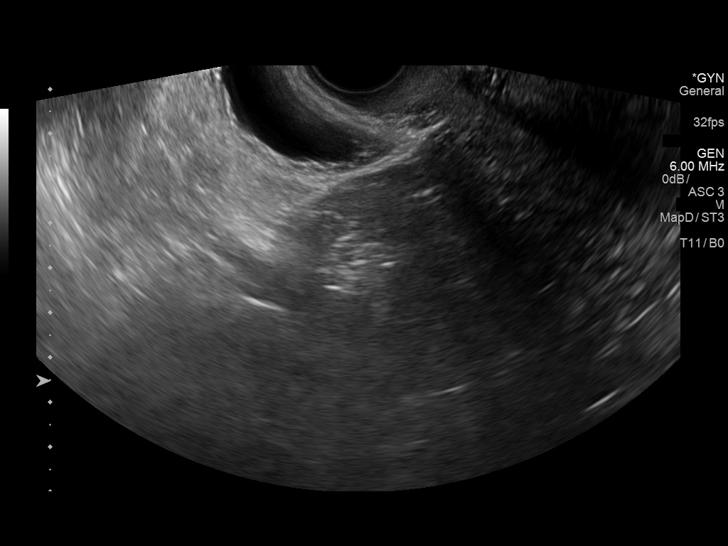

[14 of 25 positions shown; findings below may reference images not displayed]

FINDINGS: Uterus

Measurements: 7.2 x 4.4 x 4.7 cm. Right-sided uterine fundal fibroid
measures 2.8 cm.

Endometrium

Thickness: Normal, 5 mm..  No focal abnormality visualized.

Right ovary

Not visualized.  No adnexal mass.

Left ovary

Measurements: 1.4 x 2.4 x 0.9 cm. Normal appearance/no adnexal mass.

Other findings

No significant free fluid.

Mild degradation secondary to patient size.
IMPRESSION: 1. Isolated right-sided uterine fundal fibroid.
2. Lack of visualization of the right ovary.
3. Mild degradation secondary to patient size.

## 2018-11-12 DIAGNOSIS — Z8669 Personal history of other diseases of the nervous system and sense organs: Secondary | ICD-10-CM | POA: Insufficient documentation

## 2018-11-12 DIAGNOSIS — G510 Bell's palsy: Secondary | ICD-10-CM

## 2018-11-12 HISTORY — DX: Bell's palsy: G51.0

## 2019-03-12 DIAGNOSIS — R110 Nausea: Secondary | ICD-10-CM | POA: Diagnosis not present

## 2019-03-12 DIAGNOSIS — R1011 Right upper quadrant pain: Secondary | ICD-10-CM | POA: Diagnosis not present

## 2019-03-12 DIAGNOSIS — K625 Hemorrhage of anus and rectum: Secondary | ICD-10-CM | POA: Diagnosis not present

## 2019-03-12 DIAGNOSIS — Z1211 Encounter for screening for malignant neoplasm of colon: Secondary | ICD-10-CM | POA: Diagnosis not present

## 2019-03-12 DIAGNOSIS — K5904 Chronic idiopathic constipation: Secondary | ICD-10-CM | POA: Diagnosis not present

## 2019-03-16 ENCOUNTER — Other Ambulatory Visit (HOSPITAL_COMMUNITY): Payer: Self-pay | Admitting: Gastroenterology

## 2019-03-16 ENCOUNTER — Other Ambulatory Visit: Payer: Self-pay | Admitting: Gastroenterology

## 2019-03-16 DIAGNOSIS — R1011 Right upper quadrant pain: Secondary | ICD-10-CM

## 2019-04-09 ENCOUNTER — Encounter (HOSPITAL_COMMUNITY): Payer: Self-pay

## 2019-04-09 ENCOUNTER — Other Ambulatory Visit: Payer: Self-pay

## 2019-04-09 ENCOUNTER — Ambulatory Visit (HOSPITAL_COMMUNITY)
Admission: EM | Admit: 2019-04-09 | Discharge: 2019-04-09 | Disposition: A | Discharge status: Home / Self Care | Payer: BLUE CROSS/BLUE SHIELD | Attending: Internal Medicine | Admitting: Internal Medicine

## 2019-04-09 DIAGNOSIS — G510 Bell's palsy: Secondary | ICD-10-CM

## 2019-04-09 MED ORDER — PREDNISONE 20 MG PO TABS: 60.0000 mg | ORAL_TABLET | Freq: Every day | ORAL | 0 refills | Status: AC

## 2019-04-09 MED ORDER — VALACYCLOVIR HCL 1 G PO TABS: 1000.0000 mg | ORAL_TABLET | Freq: Three times a day (TID) | ORAL | 0 refills | Status: AC

## 2019-04-09 NOTE — ED Notes (Signed)
Could not obtain BP, patient stated cuff was too tight, patient requested cuff be removed.

## 2019-04-09 NOTE — ED Provider Notes (Signed)
[place]    CSN: [id number] Arrival date & time: 04/09/19  0818     History   Chief Complaint Chief Complaint  Patient presents with  . Neck Pain    HPI Kendra Castro is a 53 y.o. female with a history of hypertension comes to urgent care with 1 day history of right-sided facial weakness and decreased sensation that started yesterday.  Patient says she was at home going about her usual business last night when she noticed a change in sensation on the right side of her face as well as an unusual sensation on the tongue.  Patient went to bed and woke up this morning with some persistent symptoms.  Patient denies any change in her vision.  She is able to articulate her sentences.  No confusion or difficulty expressing words.  No numbness or tingling in the extremities.  No gait abnormalities.Marland Kitchen   HPI  Past Medical History:  Diagnosis Date  . Constipation   . Hypertension   . Obesity     Patient Active Problem List   Diagnosis Date Noted  . Neck pain 02/24/2018  . Chest pain 02/24/2018  . Anxiety-like symptoms 02/24/2018  . RLQ abdominal pain 01/05/2018  . Rotator cuff (capsule) sprain 01/05/2018  . Prediabetes 12/04/2017  . Abnormal ECG 09/15/2015  . Essential hypertension 09/15/2015    Past Surgical History:  Procedure Laterality Date  . CYST EXCISION     Lower back    OB History    Gravida  0   Para  0   Term  0   Preterm  0   AB  0   Living  0     SAB  0   TAB  0   Ectopic  0   Multiple  0   Live Births  0            Home Medications    Prior to Admission medications   Medication Sig Start Date End Date Taking? Authorizing Provider  amLODipine (NORVASC) 10 MG tablet Take 1 tablet (10 mg total) by mouth daily. 01/03/18   Everrett Coombe, MD  ibuprofen (ADVIL,MOTRIN) 800 MG tablet Take 1 tablet (800 mg total) by mouth every 8 (eight) hours as needed. 03/27/17   Fontaine, Belinda Block, MD  predniSONE (DELTASONE) 20 MG tablet Take 3  tablets (60 mg total) by mouth daily for 7 days. 04/09/19 04/16/19  Chase Picket, MD  valACYclovir (VALTREX) 1000 MG tablet Take 1 tablet (1,000 mg total) by mouth 3 (three) times daily for 7 days. 04/09/19 04/16/19  Chase Picket, MD    Family History Family History  Problem Relation Age of Onset  . Hypertension Father   . Diabetes Father   . Hypertension Mother     Social History Social History   Tobacco Use  . Smoking status: Never Smoker  . Smokeless tobacco: Never Used  Substance Use Topics  . Alcohol use: No    Alcohol/week: 0.0 standard drinks  . Drug use: No     Allergies   Patient has no known allergies.   Review of Systems Review of Systems  Constitutional: Positive for activity change. Negative for appetite change, chills, fatigue and fever.  HENT: Positive for tinnitus. Negative for congestion, drooling, ear discharge, ear pain, facial swelling, mouth sores, nosebleeds, postnasal drip, rhinorrhea, sinus pressure, sinus pain, sore throat and voice change.   Eyes: Negative.   Respiratory: Negative.   Cardiovascular: Negative.   Gastrointestinal: Negative.  Musculoskeletal: Negative.   Skin: Negative for pallor, rash and wound.  Neurological: Positive for facial asymmetry and numbness. Negative for dizziness, syncope, weakness, light-headedness and headaches.  Psychiatric/Behavioral: Negative for confusion.  All other systems reviewed and are negative.    Physical Exam Triage Vital Signs ED Triage Vitals  Enc Vitals Group     BP 04/09/19 0840 (!) 176/88     Pulse Rate 04/09/19 0840 60     Resp 04/09/19 0840 18     Temp 04/09/19 0840 98.9 F (37.2 C)     Temp Source 04/09/19 0840 Oral     SpO2 04/09/19 0840 96 %     Weight 04/09/19 0849 260 lb (117.9 kg)     Height --      Head Circumference --      Peak Flow --      Pain Score 04/09/19 0849 6     Pain Loc --      Pain Edu? --      Excl. in [place]? --    No data found.  Updated Vital Signs  BP (!) 176/88 (BP Location: Left Wrist)   Pulse 60   Temp 98.9 F (37.2 C) (Oral)   Resp 18   Wt 117.9 kg   LMP 03/17/2017   SpO2 96%   BMI 43.27 kg/m   Visual Acuity Right Eye Distance:   Left Eye Distance:   Bilateral Distance:    Right Eye Near:   Left Eye Near:    Bilateral Near:     Physical Exam HENT:     Nose: Nose normal. No rhinorrhea.     Mouth/Throat:     Mouth: Mucous membranes are moist.  Eyes:     General: No scleral icterus.       Right eye: No discharge.        Left eye: No discharge.     Extraocular Movements: Extraocular movements intact.     Conjunctiva/sclera: Conjunctivae normal.     Pupils: Pupils are equal, round, and reactive to light.  Neck:     Musculoskeletal: Normal range of motion.  Cardiovascular:     Rate and Rhythm: Normal rate and regular rhythm.  Pulmonary:     Effort: Pulmonary effort is normal.     Breath sounds: Normal breath sounds.  Abdominal:     General: Bowel sounds are normal.     Palpations: Abdomen is soft.  Neurological:     Mental Status: She is alert and oriented to person, place, and time.     Cranial Nerves: Cranial nerve deficit present.     Sensory: Sensory deficit present.     Motor: No weakness.     Coordination: Coordination normal.     Gait: Gait normal.     Deep Tendon Reflexes: Reflexes normal.     Comments: Patient has right-sided facial weakness.  She has right peripheral cranial nerve VII palsy evidenced by inability form creases the forehead, inability to resist left eye opening, right nasolabial fold flattening.  Cranial nerves II, III, IV, V, VI, VIII, IX, X and XI unremarkable bilaterally.  Cranial nerve VII (left) is without deficit.      UC Treatments / Results  Labs (all labs ordered are listed, but only abnormal results are displayed) Labs Reviewed - No data to display  EKG None  Radiology No results found.  Procedures Procedures (including critical care time)  Medications  Ordered in UC Medications - No data to display  Initial Impression /  Assessment and Plan / UC Course  I have reviewed the triage vital signs and the nursing notes.  Pertinent labs & imaging results that were available during my care of the patient were reviewed by me and considered in my medical decision making (see chart for details).     1.  Bell's palsy involving the right side of the face (House-Brackman stage IV): Prednisone 60 mg orally daily for 5 days Valacyclovir 1000 mg orally 3 times daily for 5 days Right eye patch to prevent corneal injury while sleeping   Final Clinical Impressions(s) / UC Diagnoses   Final diagnoses:  Bell's palsy   Discharge Instructions   None    ED Prescriptions    Medication Sig Dispense Auth. Provider   predniSONE (DELTASONE) 20 MG tablet Take 3 tablets (60 mg total) by mouth daily for 7 days. 21 tablet Dereona Kolodny, Myrene Galas, MD   valACYclovir (VALTREX) 1000 MG tablet Take 1 tablet (1,000 mg total) by mouth 3 (three) times daily for 7 days. 21 tablet Darral Rishel, Myrene Galas, MD     Controlled Substance Prescriptions [place] Controlled Substance Registry consulted? No   Chase Picket, MD 04/09/19 1147

## 2019-04-09 NOTE — ED Triage Notes (Signed)
Pt states she has some neck pain that started at 12:30 am and her mouth hurts too underneath her tongue on the right side.

## 2019-04-17 ENCOUNTER — Other Ambulatory Visit: Payer: Self-pay

## 2019-04-17 ENCOUNTER — Ambulatory Visit (HOSPITAL_COMMUNITY)
Admission: EM | Admit: 2019-04-17 | Discharge: 2019-04-17 | Disposition: A | Discharge status: Home / Self Care | Payer: BC Managed Care – PPO | Attending: Family Medicine | Admitting: Family Medicine

## 2019-04-17 ENCOUNTER — Encounter (HOSPITAL_COMMUNITY): Payer: Self-pay | Admitting: Emergency Medicine

## 2019-04-17 DIAGNOSIS — G510 Bell's palsy: Secondary | ICD-10-CM | POA: Diagnosis not present

## 2019-04-17 MED ORDER — PREDNISONE 20 MG PO TABS: 40.0000 mg | ORAL_TABLET | Freq: Every day | ORAL | 0 refills | Status: AC

## 2019-04-17 NOTE — ED Provider Notes (Signed)
[facility]    CSN: [id number] Arrival date & time: 04/17/19  1701     History   Chief Complaint Chief Complaint  Patient presents with  . Follow-up    HPI Kendra Castro is a 53 y.o. female presenting for follow-up of right-sided Bell's.  Patient was evaluated on 5/28 at urgent care and treated with 1 week course of Valtrex and prednisone 60 mg.  Patient states she completed her medications yesterday and has noticed some improvement in sensation of the right side of her face, lip, and tongue.  She states that she still has significant decreased sensation and facial motor skills on the right side.  Patient wearing her eye patch nightly, not routinely doing facial exercises.  Patient states that she has follow-up scheduled Wednesday with her PCP.  Patient has already looked into neurologist, though needs referral: Plans on discussing this with her PCP.  Overall, patient feels that she is doing a little bit better.  Denies fever, headache, change in vision, worsening facial paralysis, upper or lower extremity weakness or paresthesias.   Past Medical History:  Diagnosis Date  . Constipation   . Hypertension   . Obesity     Patient Active Problem List   Diagnosis Date Noted  . Neck pain 02/24/2018  . Chest pain 02/24/2018  . Anxiety-like symptoms 02/24/2018  . RLQ abdominal pain 01/05/2018  . Rotator cuff (capsule) sprain 01/05/2018  . Prediabetes 12/04/2017  . Abnormal ECG 09/15/2015  . Essential hypertension 09/15/2015    Past Surgical History:  Procedure Laterality Date  . CYST EXCISION     Lower back    OB History    Gravida  0   Para  0   Term  0   Preterm  0   AB  0   Living  0     SAB  0   TAB  0   Ectopic  0   Multiple  0   Live Births  0            Home Medications    Prior to Admission medications   Medication Sig Start Date End Date Taking? Authorizing Provider  amLODipine (NORVASC) 10 MG tablet Take 1 tablet (10 mg  total) by mouth daily. 01/03/18   Everrett Coombe, MD  ibuprofen (ADVIL,MOTRIN) 800 MG tablet Take 1 tablet (800 mg total) by mouth every 8 (eight) hours as needed. 03/27/17   Fontaine, Belinda Block, MD  predniSONE (DELTASONE) 20 MG tablet Take 2 tablets (40 mg total) by mouth daily for 7 days. 04/17/19 04/24/19  Hall-Potvin, [place], PA-C    Family History Family History  Problem Relation Age of Onset  . Hypertension Father   . Diabetes Father   . Hypertension Mother     Social History Social History   Tobacco Use  . Smoking status: Never Smoker  . Smokeless tobacco: Never Used  Substance Use Topics  . Alcohol use: No    Alcohol/week: 0.0 standard drinks  . Drug use: No     Allergies   Patient has no known allergies.   Review of Systems As per HPI   Physical Exam Triage Vital Signs ED Triage Vitals  Enc Vitals Group     BP 04/17/19 1720 (!) 142/88     Pulse Rate 04/17/19 1720 72     Resp 04/17/19 1720 18     Temp 04/17/19 1720 98.6 F (37 C)     Temp Source 04/17/19 1720 Oral  SpO2 04/17/19 1720 96 %     Weight --      Height --      Head Circumference --      Peak Flow --      Pain Score 04/17/19 1719 2     Pain Loc --      Pain Edu? --      Excl. in [employer]? --    No data found.  Updated Vital Signs BP (!) 142/88 (BP Location: Right Arm)   Pulse 72   Temp 98.6 F (37 C) (Oral)   Resp 18   LMP 03/17/2017   SpO2 96%   Visual Acuity Right Eye Distance:   Left Eye Distance:   Bilateral Distance:    Right Eye Near:   Left Eye Near:    Bilateral Near:     Physical Exam Constitutional:      General: She is not in acute distress.    Appearance: She is obese.  HENT:     Head: Normocephalic and atraumatic.  Eyes:     General: No scleral icterus.    Pupils: Pupils are equal, round, and reactive to light.  Cardiovascular:     Rate and Rhythm: Normal rate.  Pulmonary:     Effort: Pulmonary effort is normal.  Musculoskeletal: Normal range of motion.      Comments: 5/5 upper and lower extremity strength bilaterally  Neurological:     Mental Status: She is alert and oriented to person, place, and time.     Cranial Nerves: Cranial nerve deficit present.     Sensory: Sensory deficit present.     Gait: Gait normal.     Deep Tendon Reflexes: Reflexes normal.     Comments: 5/28: "Positive for right-sided facial weakness, unable to form creases the forehead, inability to resist left eye opening, right nasolabial fold flattening.  Cranial nerves II, III, IV, V, VI, VIII, IX, X and XI unremarkable bilaterally.  Cranial nerve VII (left) is without deficit".  TODAY: Patient with slightly increased ability to raise right brow, increases sensation to touch on right side of face   Psychiatric:        Mood and Affect: Mood normal.        Judgment: Judgment normal.      UC Treatments / Results  Labs (all labs ordered are listed, but only abnormal results are displayed) Labs Reviewed - No data to display  EKG None  Radiology No results found.  Procedures Procedures (including critical care time)  Medications Ordered in UC Medications - No data to display  Initial Impression / Assessment and Plan / UC Course  I have reviewed the triage vital signs and the nursing notes.  Pertinent labs & imaging results that were available during my care of the patient were reviewed by me and considered in my medical decision making (see chart for details).     53 year old female status post completion of 1 week antiviral and steroid with mild improvement of symptoms.  Will complete an additional week of steroid with close surveillance by PCP and possible neurologic consult to be completed by PCP per patient.  Return precautions discussed, patient verbalized understanding. Final Clinical Impressions(s) / UC Diagnoses   Final diagnoses:  Right-sided Bell's palsy     Discharge Instructions     Take 2 tabs prednisone daily for 1 week. Do face exercises  2-3 times daily as instructed. Keep appointment with your PCP on Wednesday 6/10 as they can submit your referral  to neurology.    ED Prescriptions    Medication Sig Dispense Auth. Provider   predniSONE (DELTASONE) 20 MG tablet Take 2 tablets (40 mg total) by mouth daily for 7 days. 14 tablet Hall-Potvin, [place], PA-C     Controlled Substance Prescriptions [place] Controlled Substance Registry consulted? Not Applicable   Quincy Sheehan, Hershal Coria 04/17/19 1758

## 2019-04-17 NOTE — Discharge Instructions (Addendum)
Take 2 tabs prednisone daily for 1 week. Do face exercises 2-3 times daily as instructed. Keep appointment with your PCP on Wednesday 6/10 as they can submit your referral to neurology.

## 2019-04-17 NOTE — ED Triage Notes (Signed)
Pt is following up with dx here with belles palsy. Pt says the pain is about a 2 and is feeling some tingling. Pt say the medication she was given is working.

## 2019-04-22 ENCOUNTER — Ambulatory Visit (HOSPITAL_COMMUNITY): Payer: BC Managed Care – PPO

## 2019-04-22 ENCOUNTER — Encounter (HOSPITAL_COMMUNITY): Payer: Self-pay

## 2019-04-22 DIAGNOSIS — G510 Bell's palsy: Secondary | ICD-10-CM | POA: Diagnosis not present

## 2019-04-22 DIAGNOSIS — I10 Essential (primary) hypertension: Secondary | ICD-10-CM | POA: Diagnosis not present

## 2019-04-22 DIAGNOSIS — Z719 Counseling, unspecified: Secondary | ICD-10-CM | POA: Diagnosis not present

## 2019-04-29 DIAGNOSIS — G510 Bell's palsy: Secondary | ICD-10-CM | POA: Diagnosis not present

## 2019-04-29 DIAGNOSIS — R252 Cramp and spasm: Secondary | ICD-10-CM | POA: Diagnosis not present

## 2019-04-29 DIAGNOSIS — I10 Essential (primary) hypertension: Secondary | ICD-10-CM | POA: Diagnosis not present

## 2019-05-05 DIAGNOSIS — G510 Bell's palsy: Secondary | ICD-10-CM | POA: Diagnosis not present

## 2019-05-05 DIAGNOSIS — H9312 Tinnitus, left ear: Secondary | ICD-10-CM | POA: Diagnosis not present

## 2019-05-05 DIAGNOSIS — R42 Dizziness and giddiness: Secondary | ICD-10-CM | POA: Diagnosis not present

## 2019-08-05 ENCOUNTER — Encounter: Payer: Self-pay | Admitting: Gynecology

## 2019-10-19 DIAGNOSIS — R4689 Other symptoms and signs involving appearance and behavior: Secondary | ICD-10-CM | POA: Diagnosis not present

## 2019-10-19 DIAGNOSIS — I10 Essential (primary) hypertension: Secondary | ICD-10-CM | POA: Diagnosis not present

## 2019-10-19 DIAGNOSIS — G510 Bell's palsy: Secondary | ICD-10-CM | POA: Diagnosis not present

## 2020-10-24 ENCOUNTER — Other Ambulatory Visit: Payer: Self-pay

## 2020-10-24 DIAGNOSIS — Z20822 Contact with and (suspected) exposure to covid-19: Secondary | ICD-10-CM

## 2020-10-25 LAB — NOVEL CORONAVIRUS, NAA: SARS-CoV-2, NAA: NOT DETECTED

## 2020-10-25 LAB — SARS-COV-2, NAA 2 DAY TAT

## 2021-07-02 ENCOUNTER — Encounter (HOSPITAL_COMMUNITY): Payer: Self-pay | Admitting: Emergency Medicine

## 2021-07-02 ENCOUNTER — Other Ambulatory Visit: Payer: Self-pay

## 2021-07-02 ENCOUNTER — Emergency Department (HOSPITAL_COMMUNITY)
Admission: EM | Admit: 2021-07-02 | Discharge: 2021-07-02 | Disposition: A | Discharge status: Home / Self Care | Payer: BC Managed Care – PPO | Attending: Emergency Medicine | Admitting: Emergency Medicine

## 2021-07-02 ENCOUNTER — Emergency Department (HOSPITAL_COMMUNITY): Payer: BC Managed Care – PPO

## 2021-07-02 DIAGNOSIS — M7731 Calcaneal spur, right foot: Secondary | ICD-10-CM | POA: Diagnosis not present

## 2021-07-02 DIAGNOSIS — Z79899 Other long term (current) drug therapy: Secondary | ICD-10-CM | POA: Diagnosis not present

## 2021-07-02 DIAGNOSIS — R079 Chest pain, unspecified: Secondary | ICD-10-CM | POA: Diagnosis not present

## 2021-07-02 DIAGNOSIS — M10071 Idiopathic gout, right ankle and foot: Secondary | ICD-10-CM | POA: Insufficient documentation

## 2021-07-02 DIAGNOSIS — M1000 Idiopathic gout, unspecified site: Secondary | ICD-10-CM

## 2021-07-02 DIAGNOSIS — M79671 Pain in right foot: Secondary | ICD-10-CM | POA: Diagnosis not present

## 2021-07-02 DIAGNOSIS — R0789 Other chest pain: Secondary | ICD-10-CM | POA: Diagnosis not present

## 2021-07-02 DIAGNOSIS — I10 Essential (primary) hypertension: Secondary | ICD-10-CM | POA: Insufficient documentation

## 2021-07-02 DIAGNOSIS — M7989 Other specified soft tissue disorders: Secondary | ICD-10-CM | POA: Diagnosis not present

## 2021-07-02 LAB — HEPATIC FUNCTION PANEL
ALT: 13 U/L (ref 0–44)
AST: 23 U/L (ref 15–41)
Albumin: 3.5 g/dL (ref 3.5–5.0)
Alkaline Phosphatase: 59 U/L (ref 38–126)
Bilirubin, Direct: 0.3 mg/dL — ABNORMAL HIGH (ref 0.0–0.2)
Indirect Bilirubin: 0.8 mg/dL (ref 0.3–0.9)
Total Bilirubin: 1.1 mg/dL (ref 0.3–1.2)
Total Protein: 6.9 g/dL (ref 6.5–8.1)

## 2021-07-02 LAB — BASIC METABOLIC PANEL
Anion gap: 7 (ref 5–15)
BUN: 13 mg/dL (ref 6–20)
CO2: 26 mmol/L (ref 22–32)
Calcium: 8.7 mg/dL — ABNORMAL LOW (ref 8.9–10.3)
Chloride: 106 mmol/L (ref 98–111)
Creatinine, Ser: 1.08 mg/dL — ABNORMAL HIGH (ref 0.44–1.00)
GFR, Estimated: >60 mL/min (ref >60)
Glucose, Bld: 91 mg/dL (ref 70–99)
Potassium: 3.9 mmol/L (ref 3.5–5.1)
Sodium: 139 mmol/L (ref 135–145)

## 2021-07-02 LAB — CBC
HCT: 41.4 % (ref 36.0–46.0)
Hemoglobin: 13.2 g/dL (ref 12.0–15.0)
MCH: 27.7 pg (ref 26.0–34.0)
MCHC: 31.9 g/dL (ref 30.0–36.0)
MCV: 87 fL (ref 80.0–100.0)
Platelets: 263 10*3/uL (ref 150–400)
RBC: 4.76 MIL/uL (ref 3.87–5.11)
RDW: 13.2 % (ref 11.5–15.5)
WBC: 10 10*3/uL (ref 4.0–10.5)
nRBC: 0 % (ref 0.0–0.2)

## 2021-07-02 LAB — TROPONIN I (HIGH SENSITIVITY)
Troponin I (High Sensitivity): 5 ng/L (ref <18)
Troponin I (High Sensitivity): 4 ng/L (ref <18)

## 2021-07-02 LAB — URIC ACID: Uric Acid, Serum: 8.5 mg/dL — ABNORMAL HIGH (ref 2.5–7.1)

## 2021-07-02 LAB — I-STAT BETA HCG BLOOD, ED (MC, WL, AP ONLY): I-stat hCG, quantitative: <5 m[IU]/mL (ref <5)

## 2021-07-02 MED ORDER — IBUPROFEN 800 MG PO TABS: 800.0000 mg | ORAL_TABLET | Freq: Three times a day (TID) | ORAL | 0 refills | Status: DC | PRN

## 2021-07-02 MED ORDER — SODIUM CHLORIDE 0.9 % IV SOLN
2.0000 g | Freq: Once | INTRAVENOUS | Status: AC
  Administered 2021-07-02: 2 g via INTRAVENOUS
  Filled 2021-07-02: qty 20

## 2021-07-02 MED ORDER — CEPHALEXIN 500 MG PO CAPS: ORAL_CAPSULE | ORAL | 0 refills | Status: DC

## 2021-07-02 NOTE — Discharge Instructions (Signed)
Follow-up with your family doctor for your foot.  And follow-up with cardiology for possible stress test.  Chest pain

## 2021-07-02 NOTE — ED Triage Notes (Signed)
Pt reports leg swelling and chest pain x 3 days but denies CP at this time. Pt was sent from [facility] in [place] for further evaluation.

## 2021-07-02 NOTE — ED Provider Notes (Signed)
[facility] EMERGENCY DEPARTMENT Provider Note   CSN: [id number] Arrival date & time: 07/02/21  1717     History Chief Complaint  Patient presents with   Leg Swelling   Chest Pain    Kendra Castro is a 55 y.o. female.  Patient complains of right foot pain and also periodic chest pain that only lasts for few minutes and related.  Exertion  The history is provided by the patient and medical records.  Chest Pain Pain location:  L chest Pain quality: aching   Pain radiates to:  Does not radiate Pain severity:  Moderate Onset quality:  Sudden Timing:  Intermittent Progression:  Waxing and waning Chronicity:  New Context: not breathing   Relieved by:  Nothing Worsened by:  Nothing Ineffective treatments:  None tried Associated symptoms: no abdominal pain, no back pain, no cough, no fatigue and no headache       Past Medical History:  Diagnosis Date   Constipation    Hypertension    Obesity     Patient Active Problem List   Diagnosis Date Noted   Neck pain 02/24/2018   Chest pain 02/24/2018   Anxiety-like symptoms 02/24/2018   RLQ abdominal pain 01/05/2018   Rotator cuff (capsule) sprain 01/05/2018   Prediabetes 12/04/2017   Abnormal ECG 09/15/2015   Essential hypertension 09/15/2015    Past Surgical History:  Procedure Laterality Date   CYST EXCISION     Lower back     OB History     Gravida  0   Para  0   Term  0   Preterm  0   AB  0   Living  0      SAB  0   IAB  0   Ectopic  0   Multiple  0   Live Births  0           Family History  Problem Relation Age of Onset   Hypertension Father    Diabetes Father    Hypertension Mother     Social History   Tobacco Use   Smoking status: Never   Smokeless tobacco: Never  Vaping Use   Vaping Use: Never used  Substance Use Topics   Alcohol use: No    Alcohol/week: 0.0 standard drinks   Drug use: No    Home Medications Prior to Admission medications    Medication Sig Start Date End Date Taking? Authorizing Provider  cephALEXin (KEFLEX) 500 MG capsule 2 caps po bid x 7 days 07/02/21  Yes Milton Ferguson, MD  ibuprofen (ADVIL) 800 MG tablet Take 1 tablet (800 mg total) by mouth every 8 (eight) hours as needed. 07/02/21  Yes Milton Ferguson, MD  amLODipine (NORVASC) 10 MG tablet Take 1 tablet (10 mg total) by mouth daily. 01/03/18   Everrett Coombe, MD  ibuprofen (ADVIL,MOTRIN) 800 MG tablet Take 1 tablet (800 mg total) by mouth every 8 (eight) hours as needed. 03/27/17   Fontaine, Belinda Block, MD    Allergies    Patient has no known allergies.  Review of Systems   Review of Systems  Constitutional:  Negative for appetite change and fatigue.  HENT:  Negative for congestion, ear discharge and sinus pressure.   Eyes:  Negative for discharge.  Respiratory:  Negative for cough.   Cardiovascular:  Positive for chest pain.  Gastrointestinal:  Negative for abdominal pain and diarrhea.  Genitourinary:  Negative for frequency and hematuria.  Musculoskeletal:  Negative for back pain.  Right foot pain  Skin:  Negative for rash.  Neurological:  Negative for seizures and headaches.  Psychiatric/Behavioral:  Negative for hallucinations.    Physical Exam Updated Vital Signs BP (!) 141/58   Pulse 66   Temp 98.5 F (36.9 C) (Oral)   Resp (!) 23   LMP 03/17/2017   SpO2 99%   Physical Exam Vitals and nursing note reviewed.  Constitutional:      Appearance: She is well-developed.  HENT:     Head: Normocephalic.     Mouth/Throat:     Mouth: Mucous membranes are moist.  Eyes:     General: No scleral icterus.    Conjunctiva/sclera: Conjunctivae normal.  Neck:     Thyroid: No thyromegaly.  Cardiovascular:     Rate and Rhythm: Normal rate and regular rhythm.     Heart sounds: No murmur heard.   No friction rub. No gallop.  Pulmonary:     Breath sounds: No stridor. No wheezing or rales.  Chest:     Chest wall: No tenderness.  Abdominal:      General: There is no distension.     Tenderness: There is no abdominal tenderness. There is no rebound.  Musculoskeletal:        General: Normal range of motion.     Cervical back: Neck supple.     Comments: Tenderness or redness right foot  Lymphadenopathy:     Cervical: No cervical adenopathy.  Skin:    Findings: No erythema or rash.  Neurological:     Mental Status: She is alert and oriented to person, place, and time.     Motor: No abnormal muscle tone.     Coordination: Coordination normal.  Psychiatric:        Behavior: Behavior normal.    ED Results / Procedures / Treatments   Labs (all labs ordered are listed, but only abnormal results are displayed) Labs Reviewed  BASIC METABOLIC PANEL - Abnormal; Notable for the following components:      Result Value   Creatinine, Ser 1.08 (*)    Calcium 8.7 (*)    All other components within normal limits  HEPATIC FUNCTION PANEL - Abnormal; Notable for the following components:   Bilirubin, Direct 0.3 (*)    All other components within normal limits  URIC ACID - Abnormal; Notable for the following components:   Uric Acid, Serum 8.5 (*)    All other components within normal limits  CBC  I-STAT BETA HCG BLOOD, ED ([facility], [facility], [facility] ONLY)  TROPONIN I (HIGH SENSITIVITY)  TROPONIN I (HIGH SENSITIVITY)    EKG EKG Interpretation  Date/Time:  Sunday July 02 2021 17:24:43 EDT Ventricular Rate:  67 PR Interval:  154 QRS Duration: 88 QT Interval:  404 QTC Calculation: 426 R Axis:   -39 Text Interpretation: Normal sinus rhythm with sinus arrhythmia Left axis deviation Abnormal ECG Confirmed by Milton Ferguson [phone]) on 07/02/2021 6:11:52 PM  Radiology DG Chest 2 View  Result Date: 07/02/2021 CLINICAL DATA:  Chest pain and leg swelling for 1 day. History of diabetes and hypertension. Nonsmoker. EXAM: CHEST - 2 VIEW COMPARISON:  09/15/2015 FINDINGS: Normal heart size and pulmonary vascularity. No focal airspace disease or consolidation  in the lungs. No blunting of costophrenic angles. No pneumothorax. Mediastinal contours appear intact. Old fracture deformity of the left clavicle. IMPRESSION: No active cardiopulmonary disease. Electronically Signed   By: Lucienne Capers M.D.   On: 07/02/2021 18:12   DG Foot Complete Right  Result Date:  07/02/2021 CLINICAL DATA:  Both pain and cramping for 3 days. EXAM: RIGHT FOOT COMPLETE - 3+ VIEW COMPARISON:  12/20/2014 FINDINGS: There is no evidence of fracture or dislocation. There is no evidence of arthropathy or other focal bone abnormality. Soft tissues are unremarkable. Small plantar calcaneal spur is present. IMPRESSION: Negative. Electronically Signed   By: Nolon Nations M.D.   On: 07/02/2021 19:25    Procedures Procedures   Medications Ordered in ED Medications  cefTRIAXone (ROCEPHIN) 2 g in sodium chloride 0.9 % 100 mL IVPB (0 g Intravenous Stopped 07/02/21 1924)    ED Course  I have reviewed the triage vital signs and the nursing notes.  Pertinent labs & imaging results that were available during my care of the patient were reviewed by me and considered in my medical decision making (see chart for details).    MDM Rules/Calculators/A&P                           Patient with probable gout and atypical chest pain.  She is placed on Motrin  Also covered with Keflex in case there is a cellulitis.  She is referred to cardiology for atypical chest pain Final Clinical Impression(s) / ED Diagnoses Final diagnoses:  Idiopathic gout, unspecified chronicity, unspecified site  Atypical chest pain    Rx / DC Orders ED Discharge Orders          Ordered    ibuprofen (ADVIL) 800 MG tablet  Every 8 hours PRN        07/02/21 2020    cephALEXin (KEFLEX) 500 MG capsule        07/02/21 2020             Milton Ferguson, MD 07/03/21 [phone]

## 2021-08-14 DIAGNOSIS — H8113 Benign paroxysmal vertigo, bilateral: Secondary | ICD-10-CM | POA: Diagnosis not present

## 2021-08-14 DIAGNOSIS — M10071 Idiopathic gout, right ankle and foot: Secondary | ICD-10-CM | POA: Diagnosis not present

## 2021-08-14 DIAGNOSIS — Z1239 Encounter for other screening for malignant neoplasm of breast: Secondary | ICD-10-CM | POA: Diagnosis not present

## 2021-08-14 DIAGNOSIS — Z131 Encounter for screening for diabetes mellitus: Secondary | ICD-10-CM | POA: Diagnosis not present

## 2021-08-23 DIAGNOSIS — Z13220 Encounter for screening for lipoid disorders: Secondary | ICD-10-CM | POA: Diagnosis not present

## 2021-08-23 DIAGNOSIS — E559 Vitamin D deficiency, unspecified: Secondary | ICD-10-CM | POA: Diagnosis not present

## 2021-08-23 DIAGNOSIS — Z0000 Encounter for general adult medical examination without abnormal findings: Secondary | ICD-10-CM | POA: Diagnosis not present

## 2021-08-23 DIAGNOSIS — M10071 Idiopathic gout, right ankle and foot: Secondary | ICD-10-CM | POA: Diagnosis not present

## 2021-08-23 DIAGNOSIS — R6889 Other general symptoms and signs: Secondary | ICD-10-CM | POA: Diagnosis not present

## 2021-08-23 DIAGNOSIS — K30 Functional dyspepsia: Secondary | ICD-10-CM | POA: Diagnosis not present

## 2021-08-23 DIAGNOSIS — H8113 Benign paroxysmal vertigo, bilateral: Secondary | ICD-10-CM | POA: Diagnosis not present

## 2021-08-23 DIAGNOSIS — E669 Obesity, unspecified: Secondary | ICD-10-CM | POA: Diagnosis not present

## 2021-08-23 DIAGNOSIS — R7303 Prediabetes: Secondary | ICD-10-CM | POA: Diagnosis not present

## 2021-09-10 DIAGNOSIS — Z20822 Contact with and (suspected) exposure to covid-19: Secondary | ICD-10-CM | POA: Diagnosis not present

## 2021-10-11 ENCOUNTER — Other Ambulatory Visit: Payer: Self-pay | Admitting: Internal Medicine

## 2021-10-11 DIAGNOSIS — Z1231 Encounter for screening mammogram for malignant neoplasm of breast: Secondary | ICD-10-CM

## 2021-10-30 ENCOUNTER — Encounter: Payer: Self-pay | Admitting: Internal Medicine

## 2021-11-22 DIAGNOSIS — H5310 Unspecified subjective visual disturbances: Secondary | ICD-10-CM | POA: Diagnosis not present

## 2021-12-29 ENCOUNTER — Other Ambulatory Visit: Payer: Self-pay | Admitting: Internal Medicine

## 2021-12-29 DIAGNOSIS — R7303 Prediabetes: Secondary | ICD-10-CM | POA: Diagnosis not present

## 2021-12-29 DIAGNOSIS — Z20828 Contact with and (suspected) exposure to other viral communicable diseases: Secondary | ICD-10-CM | POA: Diagnosis not present

## 2021-12-29 DIAGNOSIS — J209 Acute bronchitis, unspecified: Secondary | ICD-10-CM | POA: Diagnosis not present

## 2021-12-29 DIAGNOSIS — J101 Influenza due to other identified influenza virus with other respiratory manifestations: Secondary | ICD-10-CM | POA: Diagnosis not present

## 2021-12-30 LAB — INFLUENZA A AND B AG, IMMUNOASSAY
INFLUENZA A ANTIGEN: NOT DETECTED
INFLUENZA B ANTIGEN: NOT DETECTED
MICRO NUMBER:: 13024545
SPECIMEN QUALITY:: ADEQUATE

## 2021-12-30 LAB — SARS-COV-2 RNA,(COVID-19) QUALITATIVE NAAT: SARS CoV2 RNA: NOT DETECTED

## 2022-01-04 DIAGNOSIS — H8113 Benign paroxysmal vertigo, bilateral: Secondary | ICD-10-CM | POA: Diagnosis not present

## 2022-01-04 DIAGNOSIS — J069 Acute upper respiratory infection, unspecified: Secondary | ICD-10-CM | POA: Diagnosis not present

## 2022-01-04 DIAGNOSIS — R7303 Prediabetes: Secondary | ICD-10-CM | POA: Diagnosis not present

## 2022-04-02 DIAGNOSIS — B355 Tinea imbricata: Secondary | ICD-10-CM | POA: Diagnosis not present

## 2022-04-02 DIAGNOSIS — R7303 Prediabetes: Secondary | ICD-10-CM | POA: Diagnosis not present

## 2022-04-02 DIAGNOSIS — J069 Acute upper respiratory infection, unspecified: Secondary | ICD-10-CM | POA: Diagnosis not present

## 2022-04-02 DIAGNOSIS — I889 Nonspecific lymphadenitis, unspecified: Secondary | ICD-10-CM | POA: Diagnosis not present

## 2022-04-16 DIAGNOSIS — M10071 Idiopathic gout, right ankle and foot: Secondary | ICD-10-CM | POA: Diagnosis not present

## 2022-04-16 DIAGNOSIS — R7303 Prediabetes: Secondary | ICD-10-CM | POA: Diagnosis not present

## 2022-04-16 DIAGNOSIS — L680 Hirsutism: Secondary | ICD-10-CM | POA: Diagnosis not present

## 2022-07-18 DIAGNOSIS — M542 Cervicalgia: Secondary | ICD-10-CM | POA: Diagnosis not present

## 2022-07-18 DIAGNOSIS — J302 Other seasonal allergic rhinitis: Secondary | ICD-10-CM | POA: Diagnosis not present

## 2022-07-18 DIAGNOSIS — R7303 Prediabetes: Secondary | ICD-10-CM | POA: Diagnosis not present

## 2022-08-20 ENCOUNTER — Observation Stay (HOSPITAL_COMMUNITY)
Admission: EM | Admit: 2022-08-20 | Discharge: 2022-08-23 | Disposition: A | Discharge status: Home / Self Care | Payer: BC Managed Care – PPO | Attending: Internal Medicine | Admitting: Internal Medicine

## 2022-08-20 ENCOUNTER — Emergency Department (HOSPITAL_COMMUNITY): Payer: BC Managed Care – PPO

## 2022-08-20 ENCOUNTER — Other Ambulatory Visit: Payer: Self-pay

## 2022-08-20 ENCOUNTER — Encounter (HOSPITAL_COMMUNITY): Payer: Self-pay | Admitting: Internal Medicine

## 2022-08-20 ENCOUNTER — Observation Stay (HOSPITAL_COMMUNITY): Payer: BC Managed Care – PPO | Admitting: Anesthesiology

## 2022-08-20 ENCOUNTER — Encounter (HOSPITAL_COMMUNITY)
Admission: EM | Disposition: A | Discharge status: Home / Self Care | Payer: Self-pay | Source: Home / Self Care | Attending: Emergency Medicine

## 2022-08-20 ENCOUNTER — Observation Stay (HOSPITAL_COMMUNITY): Payer: BC Managed Care – PPO

## 2022-08-20 DIAGNOSIS — R0602 Shortness of breath: Secondary | ICD-10-CM | POA: Insufficient documentation

## 2022-08-20 DIAGNOSIS — K6389 Other specified diseases of intestine: Secondary | ICD-10-CM | POA: Diagnosis not present

## 2022-08-20 DIAGNOSIS — J479 Bronchiectasis, uncomplicated: Secondary | ICD-10-CM | POA: Diagnosis not present

## 2022-08-20 DIAGNOSIS — K8012 Calculus of gallbladder with acute and chronic cholecystitis without obstruction: Secondary | ICD-10-CM | POA: Diagnosis not present

## 2022-08-20 DIAGNOSIS — R911 Solitary pulmonary nodule: Secondary | ICD-10-CM | POA: Diagnosis not present

## 2022-08-20 DIAGNOSIS — Z9049 Acquired absence of other specified parts of digestive tract: Secondary | ICD-10-CM | POA: Diagnosis not present

## 2022-08-20 DIAGNOSIS — R001 Bradycardia, unspecified: Secondary | ICD-10-CM | POA: Diagnosis not present

## 2022-08-20 DIAGNOSIS — R7303 Prediabetes: Secondary | ICD-10-CM | POA: Diagnosis present

## 2022-08-20 DIAGNOSIS — K810 Acute cholecystitis: Secondary | ICD-10-CM | POA: Diagnosis not present

## 2022-08-20 DIAGNOSIS — K8020 Calculus of gallbladder without cholecystitis without obstruction: Secondary | ICD-10-CM | POA: Diagnosis not present

## 2022-08-20 DIAGNOSIS — K8010 Calculus of gallbladder with chronic cholecystitis without obstruction: Principal | ICD-10-CM | POA: Insufficient documentation

## 2022-08-20 DIAGNOSIS — I10 Essential (primary) hypertension: Secondary | ICD-10-CM | POA: Diagnosis present

## 2022-08-20 DIAGNOSIS — K639 Disease of intestine, unspecified: Secondary | ICD-10-CM

## 2022-08-20 DIAGNOSIS — R0600 Dyspnea, unspecified: Secondary | ICD-10-CM | POA: Diagnosis not present

## 2022-08-20 DIAGNOSIS — Z79899 Other long term (current) drug therapy: Secondary | ICD-10-CM | POA: Diagnosis not present

## 2022-08-20 DIAGNOSIS — K760 Fatty (change of) liver, not elsewhere classified: Secondary | ICD-10-CM | POA: Diagnosis not present

## 2022-08-20 DIAGNOSIS — K8000 Calculus of gallbladder with acute cholecystitis without obstruction: Secondary | ICD-10-CM | POA: Diagnosis not present

## 2022-08-20 DIAGNOSIS — R0789 Other chest pain: Secondary | ICD-10-CM | POA: Diagnosis not present

## 2022-08-20 DIAGNOSIS — K828 Other specified diseases of gallbladder: Secondary | ICD-10-CM | POA: Diagnosis not present

## 2022-08-20 DIAGNOSIS — D72829 Elevated white blood cell count, unspecified: Secondary | ICD-10-CM

## 2022-08-20 DIAGNOSIS — R112 Nausea with vomiting, unspecified: Secondary | ICD-10-CM | POA: Diagnosis not present

## 2022-08-20 DIAGNOSIS — E669 Obesity, unspecified: Secondary | ICD-10-CM

## 2022-08-20 DIAGNOSIS — R197 Diarrhea, unspecified: Secondary | ICD-10-CM | POA: Diagnosis not present

## 2022-08-20 DIAGNOSIS — R918 Other nonspecific abnormal finding of lung field: Secondary | ICD-10-CM

## 2022-08-20 DIAGNOSIS — R072 Precordial pain: Secondary | ICD-10-CM | POA: Diagnosis not present

## 2022-08-20 DIAGNOSIS — R079 Chest pain, unspecified: Secondary | ICD-10-CM | POA: Diagnosis not present

## 2022-08-20 HISTORY — DX: Obesity, unspecified: E66.9

## 2022-08-20 HISTORY — PX: CHOLECYSTECTOMY: SHX55

## 2022-08-20 HISTORY — PX: LAPAROSCOPIC ABDOMINAL EXPLORATION: SHX6249

## 2022-08-20 HISTORY — DX: Leiomyoma of uterus, unspecified: D25.9

## 2022-08-20 HISTORY — DX: Other nonspecific abnormal finding of lung field: R91.8

## 2022-08-20 HISTORY — DX: Elevated white blood cell count, unspecified: D72.829

## 2022-08-20 HISTORY — DX: Disease of intestine, unspecified: K63.9

## 2022-08-20 HISTORY — DX: Calculus of gallbladder without cholecystitis without obstruction: K80.20

## 2022-08-20 HISTORY — DX: Bradycardia, unspecified: R00.1

## 2022-08-20 LAB — COMPREHENSIVE METABOLIC PANEL
ALT: 14 U/L (ref 0–44)
AST: 23 U/L (ref 15–41)
Albumin: 3.8 g/dL (ref 3.5–5.0)
Alkaline Phosphatase: 58 U/L (ref 38–126)
Anion gap: 10 (ref 5–15)
BUN: 10 mg/dL (ref 6–20)
CO2: 25 mmol/L (ref 22–32)
Calcium: 8.9 mg/dL (ref 8.9–10.3)
Chloride: 103 mmol/L (ref 98–111)
Creatinine, Ser: 1.05 mg/dL — ABNORMAL HIGH (ref 0.44–1.00)
GFR, Estimated: >60 mL/min (ref >60)
Glucose, Bld: 157 mg/dL — ABNORMAL HIGH (ref 70–99)
Potassium: 3.7 mmol/L (ref 3.5–5.1)
Sodium: 138 mmol/L (ref 135–145)
Total Bilirubin: 1.1 mg/dL (ref 0.3–1.2)
Total Protein: 7.6 g/dL (ref 6.5–8.1)

## 2022-08-20 LAB — CBC WITH DIFFERENTIAL/PLATELET
Abs Immature Granulocytes: 0.12 10*3/uL — ABNORMAL HIGH (ref 0.00–0.07)
Basophils Absolute: 0.1 10*3/uL (ref 0.0–0.1)
Basophils Relative: 1 %
Eosinophils Absolute: 0.1 10*3/uL (ref 0.0–0.5)
Eosinophils Relative: 1 %
HCT: 40 % (ref 36.0–46.0)
Hemoglobin: 13.5 g/dL (ref 12.0–15.0)
Immature Granulocytes: 1 %
Lymphocytes Relative: 15 %
Lymphs Abs: 1.9 10*3/uL (ref 0.7–4.0)
MCH: 28.7 pg (ref 26.0–34.0)
MCHC: 33.8 g/dL (ref 30.0–36.0)
MCV: 84.9 fL (ref 80.0–100.0)
Monocytes Absolute: 0.4 10*3/uL (ref 0.1–1.0)
Monocytes Relative: 3 %
Neutro Abs: 10.6 10*3/uL — ABNORMAL HIGH (ref 1.7–7.7)
Neutrophils Relative %: 79 %
Platelets: 284 10*3/uL (ref 150–400)
RBC: 4.71 MIL/uL (ref 3.87–5.11)
RDW: 13.3 % (ref 11.5–15.5)
WBC: 13.2 10*3/uL — ABNORMAL HIGH (ref 4.0–10.5)
nRBC: 0 % (ref 0.0–0.2)

## 2022-08-20 LAB — I-STAT CHEM 8, ED
BUN: 10 mg/dL (ref 6–20)
Calcium, Ion: 1.14 mmol/L — ABNORMAL LOW (ref 1.15–1.40)
Chloride: 101 mmol/L (ref 98–111)
Creatinine, Ser: 1 mg/dL (ref 0.44–1.00)
Glucose, Bld: 152 mg/dL — ABNORMAL HIGH (ref 70–99)
HCT: 41 % (ref 36.0–46.0)
Hemoglobin: 13.9 g/dL (ref 12.0–15.0)
Potassium: 3.6 mmol/L (ref 3.5–5.1)
Sodium: 140 mmol/L (ref 135–145)
TCO2: 28 mmol/L (ref 22–32)

## 2022-08-20 LAB — SEDIMENTATION RATE: Sed Rate: 20 mm/hr (ref 0–22)

## 2022-08-20 LAB — LIPASE, BLOOD: Lipase: 40 U/L (ref 11–51)

## 2022-08-20 LAB — C-REACTIVE PROTEIN: CRP: 3.4 mg/dL — ABNORMAL HIGH (ref <1.0)

## 2022-08-20 LAB — TROPONIN I (HIGH SENSITIVITY)
Troponin I (High Sensitivity): 6 ng/L (ref <18)
Troponin I (High Sensitivity): 6 ng/L (ref <18)

## 2022-08-20 LAB — HIV ANTIBODY (ROUTINE TESTING W REFLEX): HIV Screen 4th Generation wRfx: NONREACTIVE

## 2022-08-20 SURGERY — LAPAROSCOPIC CHOLECYSTECTOMY WITH INTRAOPERATIVE CHOLANGIOGRAM
Anesthesia: General | Site: Abdomen

## 2022-08-20 MED ORDER — OXYCODONE HCL 5 MG PO TABS: 5.0000 mg | ORAL_TABLET | Freq: Once | ORAL | Status: DC | PRN

## 2022-08-20 MED ORDER — ORAL CARE MOUTH RINSE: 15.0000 mL | Freq: Once | OROMUCOSAL | Status: AC

## 2022-08-20 MED ORDER — SUGAMMADEX SODIUM 200 MG/2ML IV SOLN
INTRAVENOUS | Status: DC | PRN
  Administered 2022-08-20: 200 mg via INTRAVENOUS

## 2022-08-20 MED ORDER — LIDOCAINE 2% (20 MG/ML) 5 ML SYRINGE
INTRAMUSCULAR | Status: DC | PRN
  Administered 2022-08-20: 60 mg via INTRAVENOUS

## 2022-08-20 MED ORDER — SODIUM CHLORIDE 0.9 % IV SOLN: INTRAVENOUS | Status: DC

## 2022-08-20 MED ORDER — IOHEXOL 350 MG/ML SOLN
75.0000 mL | Freq: Once | INTRAVENOUS | Status: AC | PRN
  Administered 2022-08-20: 75 mL via INTRAVENOUS

## 2022-08-20 MED ORDER — ONDANSETRON HCL 4 MG/2ML IJ SOLN
INTRAMUSCULAR | Status: DC | PRN
  Administered 2022-08-20: 4 mg via INTRAVENOUS

## 2022-08-20 MED ORDER — HYDRALAZINE HCL 20 MG/ML IJ SOLN: 10.0000 mg | INTRAMUSCULAR | Status: DC | PRN

## 2022-08-20 MED ORDER — SUCCINYLCHOLINE CHLORIDE 200 MG/10ML IV SOSY
PREFILLED_SYRINGE | INTRAVENOUS | Status: DC | PRN
  Administered 2022-08-20: 120 mg via INTRAVENOUS

## 2022-08-20 MED ORDER — ROCURONIUM BROMIDE 10 MG/ML (PF) SYRINGE
PREFILLED_SYRINGE | INTRAVENOUS | Status: DC | PRN
  Administered 2022-08-20: 50 mg via INTRAVENOUS

## 2022-08-20 MED ORDER — MORPHINE SULFATE (PF) 4 MG/ML IV SOLN
4.0000 mg | Freq: Once | INTRAVENOUS | Status: AC
  Administered 2022-08-20: 4 mg via INTRAVENOUS
  Filled 2022-08-20: qty 1

## 2022-08-20 MED ORDER — PROPOFOL 10 MG/ML IV BOLUS
INTRAVENOUS | Status: AC
  Filled 2022-08-20: qty 20

## 2022-08-20 MED ORDER — OXYCODONE HCL 5 MG PO TABS
5.0000 mg | ORAL_TABLET | ORAL | Status: DC | PRN
  Administered 2022-08-20 – 2022-08-21 (×2): 5 mg via ORAL
  Filled 2022-08-20 (×2): qty 1

## 2022-08-20 MED ORDER — FENTANYL CITRATE (PF) 250 MCG/5ML IJ SOLN
INTRAMUSCULAR | Status: AC
  Filled 2022-08-20: qty 5

## 2022-08-20 MED ORDER — EPHEDRINE 5 MG/ML INJ
INTRAVENOUS | Status: AC
  Filled 2022-08-20: qty 5

## 2022-08-20 MED ORDER — SUCCINYLCHOLINE CHLORIDE 200 MG/10ML IV SOSY
PREFILLED_SYRINGE | INTRAVENOUS | Status: AC
  Filled 2022-08-20: qty 10

## 2022-08-20 MED ORDER — ONDANSETRON HCL 4 MG/2ML IJ SOLN: 4.0000 mg | Freq: Four times a day (QID) | INTRAMUSCULAR | Status: DC | PRN

## 2022-08-20 MED ORDER — MORPHINE SULFATE (PF) 2 MG/ML IV SOLN: 2.0000 mg | INTRAVENOUS | Status: DC | PRN

## 2022-08-20 MED ORDER — DEXAMETHASONE SODIUM PHOSPHATE 10 MG/ML IJ SOLN
INTRAMUSCULAR | Status: DC | PRN
  Administered 2022-08-20: 10 mg via INTRAVENOUS

## 2022-08-20 MED ORDER — ONDANSETRON 4 MG PO TBDP: 4.0000 mg | ORAL_TABLET | Freq: Once | ORAL | Status: DC

## 2022-08-20 MED ORDER — STERILE WATER FOR IRRIGATION IR SOLN
Status: DC | PRN
  Administered 2022-08-20: 500 mL

## 2022-08-20 MED ORDER — MORPHINE SULFATE (PF) 2 MG/ML IV SOLN: 2.0000 mg | INTRAVENOUS | Status: AC | PRN

## 2022-08-20 MED ORDER — SODIUM CHLORIDE 0.9 % IR SOLN
Status: DC | PRN
  Administered 2022-08-20: 1000 mL

## 2022-08-20 MED ORDER — OXYCODONE HCL 5 MG/5ML PO SOLN: 5.0000 mg | Freq: Once | ORAL | Status: DC | PRN

## 2022-08-20 MED ORDER — PROPOFOL 10 MG/ML IV BOLUS
INTRAVENOUS | Status: DC | PRN
  Administered 2022-08-20: 190 mg via INTRAVENOUS

## 2022-08-20 MED ORDER — FENTANYL CITRATE (PF) 100 MCG/2ML IJ SOLN
25.0000 ug | INTRAMUSCULAR | Status: DC | PRN
  Administered 2022-08-20 (×2): 50 ug via INTRAVENOUS

## 2022-08-20 MED ORDER — ENOXAPARIN SODIUM 60 MG/0.6ML IJ SOSY
50.0000 mg | PREFILLED_SYRINGE | INTRAMUSCULAR | Status: DC
  Administered 2022-08-20 – 2022-08-22 (×3): 50 mg via SUBCUTANEOUS
  Filled 2022-08-20 (×3): qty 0.6

## 2022-08-20 MED ORDER — ROCURONIUM BROMIDE 10 MG/ML (PF) SYRINGE
PREFILLED_SYRINGE | INTRAVENOUS | Status: AC
  Filled 2022-08-20: qty 10

## 2022-08-20 MED ORDER — FENTANYL CITRATE (PF) 100 MCG/2ML IJ SOLN
INTRAMUSCULAR | Status: AC
  Filled 2022-08-20: qty 2

## 2022-08-20 MED ORDER — FAMOTIDINE IN NACL 20-0.9 MG/50ML-% IV SOLN
20.0000 mg | Freq: Once | INTRAVENOUS | Status: AC
  Administered 2022-08-20: 20 mg via INTRAVENOUS
  Filled 2022-08-20: qty 50

## 2022-08-20 MED ORDER — SODIUM CHLORIDE 0.9 % IV SOLN
2.0000 g | INTRAVENOUS | Status: DC
  Administered 2022-08-21 – 2022-08-23 (×3): 2 g via INTRAVENOUS
  Filled 2022-08-20 (×3): qty 20

## 2022-08-20 MED ORDER — PHENYLEPHRINE 80 MCG/ML (10ML) SYRINGE FOR IV PUSH (FOR BLOOD PRESSURE SUPPORT): PREFILLED_SYRINGE | INTRAVENOUS | Status: DC | PRN

## 2022-08-20 MED ORDER — 0.9 % SODIUM CHLORIDE (POUR BTL) OPTIME
TOPICAL | Status: DC | PRN
  Administered 2022-08-20: 1000 mL

## 2022-08-20 MED ORDER — LACTATED RINGERS IV SOLN: INTRAVENOUS | Status: DC

## 2022-08-20 MED ORDER — MIDAZOLAM HCL 2 MG/2ML IJ SOLN
INTRAMUSCULAR | Status: AC
  Filled 2022-08-20: qty 2

## 2022-08-20 MED ORDER — MIDAZOLAM HCL 2 MG/2ML IJ SOLN
INTRAMUSCULAR | Status: DC | PRN
  Administered 2022-08-20: 2 mg via INTRAVENOUS

## 2022-08-20 MED ORDER — ONDANSETRON HCL 4 MG/2ML IJ SOLN
INTRAMUSCULAR | Status: AC
  Filled 2022-08-20: qty 2

## 2022-08-20 MED ORDER — ONDANSETRON HCL 4 MG PO TABS: 4.0000 mg | ORAL_TABLET | Freq: Four times a day (QID) | ORAL | Status: DC | PRN

## 2022-08-20 MED ORDER — SODIUM CHLORIDE 0.9 % IV SOLN
2.0000 g | Freq: Once | INTRAVENOUS | Status: AC
  Administered 2022-08-20: 2 g via INTRAVENOUS
  Filled 2022-08-20: qty 20

## 2022-08-20 MED ORDER — FENTANYL CITRATE (PF) 250 MCG/5ML IJ SOLN
INTRAMUSCULAR | Status: DC | PRN
  Administered 2022-08-20: 100 ug via INTRAVENOUS

## 2022-08-20 MED ORDER — BUPIVACAINE-EPINEPHRINE (PF) 0.25% -1:200000 IJ SOLN
INTRAMUSCULAR | Status: AC
  Filled 2022-08-20: qty 30

## 2022-08-20 MED ORDER — ACETAMINOPHEN 650 MG RE SUPP: 650.0000 mg | Freq: Four times a day (QID) | RECTAL | Status: DC | PRN

## 2022-08-20 MED ORDER — ACETAMINOPHEN 325 MG PO TABS: 650.0000 mg | ORAL_TABLET | Freq: Four times a day (QID) | ORAL | Status: DC | PRN

## 2022-08-20 MED ORDER — ALBUTEROL SULFATE (2.5 MG/3ML) 0.083% IN NEBU: 2.5000 mg | INHALATION_SOLUTION | Freq: Four times a day (QID) | RESPIRATORY_TRACT | Status: DC | PRN

## 2022-08-20 MED ORDER — DEXAMETHASONE SODIUM PHOSPHATE 10 MG/ML IJ SOLN
INTRAMUSCULAR | Status: AC
  Filled 2022-08-20: qty 1

## 2022-08-20 MED ORDER — BUPIVACAINE-EPINEPHRINE 0.25% -1:200000 IJ SOLN
INTRAMUSCULAR | Status: DC | PRN
  Administered 2022-08-20: 13 mL

## 2022-08-20 MED ORDER — SODIUM CHLORIDE 0.9% FLUSH
3.0000 mL | Freq: Two times a day (BID) | INTRAVENOUS | Status: DC
  Administered 2022-08-22: 3 mL via INTRAVENOUS

## 2022-08-20 MED ORDER — CHLORHEXIDINE GLUCONATE 0.12 % MT SOLN
15.0000 mL | Freq: Once | OROMUCOSAL | Status: AC
  Administered 2022-08-20: 15 mL via OROMUCOSAL
  Filled 2022-08-20: qty 15

## 2022-08-20 MED ORDER — LACTATED RINGERS IV SOLN: INTRAVENOUS | Status: DC | PRN

## 2022-08-20 MED ORDER — EPHEDRINE SULFATE-NACL 50-0.9 MG/10ML-% IV SOSY
PREFILLED_SYRINGE | INTRAVENOUS | Status: DC | PRN
  Administered 2022-08-20: 10 mg via INTRAVENOUS
  Administered 2022-08-20: 15 mg via INTRAVENOUS

## 2022-08-20 MED ORDER — ONDANSETRON HCL 4 MG/2ML IJ SOLN
4.0000 mg | Freq: Once | INTRAMUSCULAR | Status: AC
  Administered 2022-08-20: 4 mg via INTRAVENOUS
  Filled 2022-08-20: qty 2

## 2022-08-20 MED ORDER — SODIUM CHLORIDE 0.9 % IV SOLN
INTRAVENOUS | Status: DC | PRN
  Administered 2022-08-20: 14 mL

## 2022-08-20 MED ORDER — ENOXAPARIN SODIUM 40 MG/0.4ML IJ SOSY: 40.0000 mg | PREFILLED_SYRINGE | INTRAMUSCULAR | Status: DC

## 2022-08-20 MED ORDER — LIDOCAINE 2% (20 MG/ML) 5 ML SYRINGE
INTRAMUSCULAR | Status: AC
  Filled 2022-08-20: qty 5

## 2022-08-20 SURGICAL SUPPLY — 42 items
APPLIER CLIP ROT 10 11.4 M/L (STAPLE) ×1
BAG COUNTER SPONGE SURGICOUNT (BAG) ×1 IMPLANT
BENZOIN TINCTURE PRP APPL 2/3 (GAUZE/BANDAGES/DRESSINGS) ×1 IMPLANT
BLADE CLIPPER SURG (BLADE) IMPLANT
CANISTER SUCT 3000ML PPV (MISCELLANEOUS) ×1 IMPLANT
CHLORAPREP W/TINT 26 (MISCELLANEOUS) ×1 IMPLANT
CLIP APPLIE ROT 10 11.4 M/L (STAPLE) ×1 IMPLANT
COVER MAYO STAND STRL (DRAPES) ×1 IMPLANT
COVER SURGICAL LIGHT HANDLE (MISCELLANEOUS) ×1 IMPLANT
DRAPE C-ARM 42X120 X-RAY (DRAPES) ×1 IMPLANT
DRSG TEGADERM 2-3/8X2-3/4 SM (GAUZE/BANDAGES/DRESSINGS) ×3 IMPLANT
DRSG TEGADERM 4X4.75 (GAUZE/BANDAGES/DRESSINGS) ×1 IMPLANT
ELECT REM PT RETURN 9FT ADLT (ELECTROSURGICAL) ×1
ELECTRODE REM PT RTRN 9FT ADLT (ELECTROSURGICAL) ×1 IMPLANT
GAUZE SPONGE 2X2 8PLY STRL LF (GAUZE/BANDAGES/DRESSINGS) ×1 IMPLANT
GLOVE BIO SURGEON STRL SZ7 (GLOVE) ×1 IMPLANT
GLOVE BIOGEL PI IND STRL 7.5 (GLOVE) ×1 IMPLANT
GOWN STRL REUS W/ TWL LRG LVL3 (GOWN DISPOSABLE) ×3 IMPLANT
GOWN STRL REUS W/TWL LRG LVL3 (GOWN DISPOSABLE) ×3
KIT BASIN OR (CUSTOM PROCEDURE TRAY) ×1 IMPLANT
KIT TURNOVER KIT B (KITS) ×1 IMPLANT
NS IRRIG 1000ML POUR BTL (IV SOLUTION) ×1 IMPLANT
PAD ARMBOARD 7.5X6 YLW CONV (MISCELLANEOUS) ×1 IMPLANT
POUCH RETRIEVAL ECOSAC 10 (ENDOMECHANICALS) IMPLANT
POUCH RETRIEVAL ECOSAC 10MM (ENDOMECHANICALS) ×1
SCISSORS LAP 5X35 DISP (ENDOMECHANICALS) ×1 IMPLANT
SET CHOLANGIOGRAPH 5 50 .035 (SET/KITS/TRAYS/PACK) ×1 IMPLANT
SET IRRIG TUBING LAPAROSCOPIC (IRRIGATION / IRRIGATOR) ×1 IMPLANT
SET TUBE SMOKE EVAC HIGH FLOW (TUBING) ×1 IMPLANT
SLEEVE ENDOPATH XCEL 5M (ENDOMECHANICALS) ×1 IMPLANT
SLEEVE Z-THREAD 5X100MM (TROCAR) IMPLANT
SPECIMEN JAR SMALL (MISCELLANEOUS) ×1 IMPLANT
STRIP CLOSURE SKIN 1/2X4 (GAUZE/BANDAGES/DRESSINGS) ×1 IMPLANT
SUT MNCRL AB 4-0 PS2 18 (SUTURE) ×1 IMPLANT
SUT VICRYL 0 UR6 27IN ABS (SUTURE) IMPLANT
TOWEL GREEN STERILE (TOWEL DISPOSABLE) ×1 IMPLANT
TOWEL GREEN STERILE FF (TOWEL DISPOSABLE) ×1 IMPLANT
TRAY LAPAROSCOPIC MC (CUSTOM PROCEDURE TRAY) ×1 IMPLANT
TROCAR 11X100 Z THREAD (TROCAR) IMPLANT
TROCAR BALLN 12MMX100 BLUNT (TROCAR) IMPLANT
TROCAR Z-THREAD OPTICAL 5X100M (TROCAR) ×1 IMPLANT
WATER STERILE IRR 1000ML POUR (IV SOLUTION) ×1 IMPLANT

## 2022-08-20 NOTE — ED Triage Notes (Signed)
Pt here from home for sob and N/V/D. Pt has increased WOB in triage. Pt states she has had 3 episodes of emesis since Saturday night. Pt has tried pepto bismol and taken "a few" '81mg'$  aspirins w/o relief.

## 2022-08-20 NOTE — Anesthesia Postprocedure Evaluation (Signed)
Anesthesia Post Note  Patient: Kendra Castro  Procedure(s) Performed: LAPAROSCOPIC CHOLECYSTECTOMY WITH INTRAOPERATIVE CHOLANGIOGRAM (Abdomen) DIAGNOSTIC LAPAROSCOPY (Abdomen)     Patient location during evaluation: PACU Anesthesia Type: General Level of consciousness: awake and alert Pain management: pain level controlled Vital Signs Assessment: post-procedure vital signs reviewed and stable Respiratory status: spontaneous breathing, nonlabored ventilation, respiratory function stable and patient connected to nasal cannula oxygen Cardiovascular status: blood pressure returned to baseline and stable Postop Assessment: no apparent nausea or vomiting Anesthetic complications: no   No notable events documented.  Last Vitals:  Vitals:   08/20/22 1230 08/20/22 1245  BP: 121/73 117/65  Pulse: (!) 51 66  Resp: 14 18  Temp:    SpO2: 94% 95%    Last Pain:  Vitals:   08/20/22 1245  TempSrc:   PainSc: 5                  Tylor Courtwright S

## 2022-08-20 NOTE — Anesthesia Procedure Notes (Signed)
Procedure Name: Intubation Date/Time: 08/20/2022 9:20 AM  Performed by: Reece Agar, CRNAPre-anesthesia Checklist: Patient identified, Emergency Drugs available, Suction available and Patient being monitored Patient Re-evaluated:Patient Re-evaluated prior to induction Oxygen Delivery Method: Circle System Utilized Preoxygenation: Pre-oxygenation with 100% oxygen Induction Type: IV induction, Rapid sequence and Cricoid Pressure applied Laryngoscope Size: Mac and 3 Grade View: Grade I Tube type: Oral Tube size: 7.0 mm Number of attempts: 1 Airway Equipment and Method: Stylet Placement Confirmation: ETT inserted through vocal cords under direct vision, positive ETCO2 and breath sounds checked- equal and bilateral Secured at: 22 cm Tube secured with: Tape Dental Injury: Teeth and Oropharynx as per pre-operative assessment  Comments: Pt having N/V. RSI performed. OGT placed smoothly post induction to decompress stomach.

## 2022-08-20 NOTE — ED Provider Triage Note (Signed)
Emergency Medicine Provider Triage Evaluation Note  Kendra Castro , a 56 y.o. female  was evaluated in triage.  Pt complains of sudden onset chest pain. Symptoms began Saturday night in her lower sternum. Pain radiates between her shoulder blades and has been constant. Patient c/o associated nausea, vomiting, SOB. She has tried [employer] without relief. Now reporting some pain radiating up the left side of her neck to her ear which is new x 1 hour. No personal or known FHx of ACS. Denies leg swelling, extremity weakness, fever, new vision changes/loss, headache.  Review of Systems  Positive: As above Negative: As above  Physical Exam  BP (!) 158/57 (BP Location: Right Arm)   Pulse (!) 48   Temp 98.1 F (36.7 C) (Oral)   Resp (!) 22   LMP 03/17/2017   SpO2 100%  Gen:   Awake, anxious appearing. Resp:  Dyspneic with mild tachypnea. Lungs grossly clear. MSK:   Moves extremities without difficulty  Other:  Obese female.  Medical Decision Making  Medically screening exam initiated at 1:12 AM.  Appropriate orders placed.  Kendra Castro was informed that the remainder of the evaluation will be completed by another provider, this initial triage assessment does not replace that evaluation, and the importance of remaining in the ED until their evaluation is complete.  Acute onset chest pain radiating to upper back, some radiation to the left neck. Dissection study ordered as well as ACS labs. BP on bilateral upper extremities without significant discrepancy.    Antonietta Breach, PA-C 08/20/22 0121

## 2022-08-20 NOTE — Transfer of Care (Signed)
Immediate Anesthesia Transfer of Care Note  Patient: Kendra Castro  Procedure(s) Performed: LAPAROSCOPIC CHOLECYSTECTOMY WITH INTRAOPERATIVE CHOLANGIOGRAM (Abdomen) DIAGNOSTIC LAPAROSCOPY (Abdomen)  Patient Location: PACU  Anesthesia Type:General  Level of Consciousness: drowsy  Airway & Oxygen Therapy: Patient Spontanous Breathing and Patient connected to face mask oxygen  Post-op Assessment: Report given to RN and Post -op Vital signs reviewed and stable  Post vital signs: Reviewed and stable  Last Vitals:  Vitals Value Taken Time  BP 119/60 08/20/22 1055  Temp 36.6 C 08/20/22 1055  Pulse 72 08/20/22 1100  Resp 24 08/20/22 1100  SpO2 99 % 08/20/22 1100  Vitals shown include unvalidated device data.  Last Pain:  Vitals:   08/20/22 0742  TempSrc:   PainSc: 4          Complications: No notable events documented.

## 2022-08-20 NOTE — ED Provider Notes (Signed)
[facility] Provider Note   CSN: [id number] Arrival date & time: 08/20/22  0024     History  Chief Complaint  Patient presents with   Chest Pain   Shortness of Breath    Kendra Castro is a 56 y.o. female who presents with concern for chest pain x3 days began in her lower sternum and radiates to her shoulder blades, constant, worsening.  Associated nausea with 3 episodes of NBNB emesis as well as shortness of breath.  Pepto-Bismol without relief.  Describes the pain as burning and radiating today to the left side of her neck prompting presentation to the ED.  No personal history of ACS or CAD.  I personally reviewed her medical records which is history of hypertension, constipation, obesity, prediabetes.  She still retains all of her abdominal organs and is not anticoagulated.  HPI     Home Medications Prior to Admission medications   Medication Sig Start Date End Date Taking? Authorizing Provider  amLODipine (NORVASC) 10 MG tablet Take 1 tablet (10 mg total) by mouth daily. 01/03/18   Everrett Coombe, MD  cephALEXin (KEFLEX) 500 MG capsule 2 caps po bid x 7 days 07/02/21   Milton Ferguson, MD  ibuprofen (ADVIL) 800 MG tablet Take 1 tablet (800 mg total) by mouth every 8 (eight) hours as needed. 07/02/21   Milton Ferguson, MD  ibuprofen (ADVIL,MOTRIN) 800 MG tablet Take 1 tablet (800 mg total) by mouth every 8 (eight) hours as needed. 03/27/17   Fontaine, Belinda Block, MD      Allergies    Patient has no known allergies.    Review of Systems   Review of Systems  Constitutional:  Positive for appetite change.  Respiratory:  Positive for shortness of breath.   Cardiovascular:  Positive for chest pain.  Gastrointestinal:  Positive for nausea and vomiting.    Physical Exam Updated Vital Signs BP (!) 163/76 (BP Location: Left Arm)   Pulse (!) 41   Temp 98.8 F (37.1 C) (Oral)   Resp 20   LMP 03/17/2017   SpO2 96%  Physical Exam Vitals and  nursing note reviewed.  Constitutional:      Appearance: She is obese. She is ill-appearing. She is not toxic-appearing.     Comments: Patient appears exquisitely uncomfortable.  HENT:     Head: Normocephalic and atraumatic.     Nose: Nose normal.     Mouth/Throat:     Mouth: Mucous membranes are moist.     Pharynx: Oropharynx is clear. Uvula midline. No oropharyngeal exudate or posterior oropharyngeal erythema.  Eyes:     General:        Right eye: No discharge.        Left eye: No discharge.     Conjunctiva/sclera: Conjunctivae normal.  Neck:     Vascular: No carotid bruit.     Trachea: Trachea and phonation normal.  Cardiovascular:     Rate and Rhythm: Regular rhythm. Bradycardia present.     Pulses: Normal pulses.     Heart sounds: Normal heart sounds. No murmur heard. Pulmonary:     Effort: Pulmonary effort is normal. No tachypnea, bradypnea, accessory muscle usage, prolonged expiration or respiratory distress.     Breath sounds: Normal breath sounds. No wheezing or rales.  Chest:     Chest wall: No mass, tenderness or edema.  Abdominal:     General: Bowel sounds are normal. There is no distension.     Palpations: Abdomen is  soft.     Tenderness: There is abdominal tenderness in the right upper quadrant and epigastric area. There is guarding. Positive signs include Murphy's sign. Negative signs include McBurney's sign, psoas sign and obturator sign.  Musculoskeletal:        General: No deformity.     Cervical back: Normal range of motion and neck supple.     Right lower leg: No tenderness. No edema.     Left lower leg: No tenderness. No edema.  Lymphadenopathy:     Cervical: No cervical adenopathy.  Skin:    General: Skin is warm and dry.     Capillary Refill: Capillary refill takes less than 2 seconds.  Neurological:     Mental Status: She is alert. Mental status is at baseline.  Psychiatric:        Mood and Affect: Mood normal.     ED Results / Procedures /  Treatments   Labs (all labs ordered are listed, but only abnormal results are displayed) Labs Reviewed  CBC WITH DIFFERENTIAL/PLATELET - Abnormal; Notable for the following components:      Result Value   WBC 13.2 (*)    Neutro Abs 10.6 (*)    Abs Immature Granulocytes 0.12 (*)    All other components within normal limits  COMPREHENSIVE METABOLIC PANEL - Abnormal; Notable for the following components:   Glucose, Bld 157 (*)    Creatinine, Ser 1.05 (*)    All other components within normal limits  I-STAT CHEM 8, ED - Abnormal; Notable for the following components:   Glucose, Bld 152 (*)    Calcium, Ion 1.14 (*)    All other components within normal limits  LIPASE, BLOOD  HIV ANTIBODY (ROUTINE TESTING W REFLEX)  SEDIMENTATION RATE  C-REACTIVE PROTEIN  TROPONIN I (HIGH SENSITIVITY)  TROPONIN I (HIGH SENSITIVITY)    EKG EKG Interpretation  Date/Time:  Monday August 20 2022 01:13:15 EDT Ventricular Rate:  46 PR Interval:  146 QRS Duration: 86 QT Interval:  480 QTC Calculation: 420 R Axis:   63 Text Interpretation: Sinus bradycardia Otherwise normal ECG Confirmed by Quintella Reichert [id number]) on 08/20/2022 2:21:32 AM  Radiology US Abdomen Limited RUQ (LIVER/GB)  Result Date: 08/20/2022 CLINICAL DATA:  Right upper quadrant pain. EXAM: ULTRASOUND ABDOMEN LIMITED RIGHT UPPER QUADRANT COMPARISON:  CT 08/20/2022 FINDINGS: Gallbladder: Cholelithiasis with 2.9 cm diameter stone in the gallbladder neck. Sludge in the gallbladder. Focal polypoid lesion at the apex of the gallbladder measuring 1.4 x 2.6 cm diameter and containing central flow on color flow Doppler imaging. This could represent neoplasm and based on size criteria, hepatic MRI is suggested for further evaluation. No gallbladder wall thickening or edema is otherwise identified. Murphy's sign is negative. Common bile duct: Diameter: 6 mm, normal Liver: Increased parenchymal echotexture of the liver suggesting fatty infiltration. No  focal mass identified. Portal vein is patent on color Doppler imaging with normal direction of blood flow towards the liver. Other: Incidental note of a 6 mm hyperechoic focus in the upper pole of the right kidney likely representing an angiomyolipoma. No shadowing is identified to suggest a stone. IMPRESSION: 1. Cholelithiasis without additional changes to suggest acute cholecystitis. 2. 1.4 x 2.6 cm polypoid lesion in the apex of the gallbladder with flow demonstrated. MRI is suggested for further characterization in the elective setting. 3. Diffuse fatty infiltration of the liver. 4. Hyperechoic lesion in the upper pole of the right kidney likely represents angiomyolipoma. Nonshadowing stone would be a secondary possibility. Electronically Signed  By: Lucienne Capers M.D.   On: 08/20/2022 04:00   CT Angio Chest/Abd/Pel for Dissection W and/or Wo Contrast  Result Date: 08/20/2022 CLINICAL DATA:  Chest pain or back pain, aortic dissection suspected. Dyspnea, nausea, vomiting, diarrhea. EXAM: CT ANGIOGRAPHY CHEST, ABDOMEN AND PELVIS TECHNIQUE: Non-contrast CT of the chest was initially obtained. Multidetector CT imaging through the chest, abdomen and pelvis was performed using the standard protocol during bolus administration of intravenous contrast. Multiplanar reconstructed images and MIPs were obtained and reviewed to evaluate the vascular anatomy. RADIATION DOSE REDUCTION: This exam was performed according to the departmental dose-optimization program which includes automated exposure control, adjustment of the mA and/or kV according to patient size and/or use of iterative reconstruction technique. CONTRAST:  21m OMNIPAQUE IOHEXOL 350 MG/ML SOLN COMPARISON:  None Available. FINDINGS: CTA CHEST FINDINGS Cardiovascular: Preferential opacification of the thoracic aorta. No evidence of thoracic aortic aneurysm or dissection. Normal heart size. No pericardial effusion. Mediastinum/Nodes: Visualized thyroid is  unremarkable. No pathologic thoracic adenopathy. Esophagus unremarkable. Lungs/Pleura: Lobulated pulmonary nodule is seen within the left lower lobe measuring 2.1 x 2.2 x 2.3 cm at axial image # 95/8 and coronal image # 115/9. The mass occludes a subsegmental bronchus of the posterior basal segment of the left lower lobe. 6 mm mean diameter noncalcified pulmonary nodule within the right middle lobe, axial image # 92, series 8, indeterminate. Numerous 3-5 mm noncalcified pulmonary nodules are identified within the left lower lobe at axial image # 72, left upper lobe at axial image # 72, # 71, and # 54, and within the right upper lobe at axial image # 68, series 8. These are indeterminate though their multiplicity favors the sequela of a remote infectious or inflammatory process. Mosaic attenuation of the pulmonary parenchyma is in keeping with multifocal air trapping related to probable small airways disease. No pneumothorax or pleural effusion. Central airways are widely patent. Musculoskeletal: No acute bone abnormality. Healed fracture deformity of the mid diaphysis of the left clavicle. No lytic or blastic bone lesion. Review of the MIP images confirms the above findings. CTA ABDOMEN AND PELVIS FINDINGS VASCULAR Aorta: Normal caliber aorta without aneurysm, dissection, vasculitis or significant stenosis. Celiac: Patent without evidence of aneurysm, dissection, vasculitis or significant stenosis. SMA: Patent without evidence of aneurysm, dissection, vasculitis or significant stenosis. Renals: Dual renal arteries bilaterally. Wide patency. Normal vascular morphology. No aneurysm or dissection. IMA: Patent without evidence of aneurysm, dissection, vasculitis or significant stenosis. Inflow: Patent without evidence of aneurysm, dissection, vasculitis or significant stenosis. Veins: No obvious venous abnormality within the limitations of this arterial phase study. Review of the MIP images confirms the above findings.  NON-VASCULAR Hepatobiliary: 3.1 cm lamellated gallstone is seen impacted within the a gallbladder neck. The gallbladder is distended. No superimposed pericholecystic inflammatory changes, however, are identified to suggest changes of acute cholecystitis. Liver unremarkable. No intra or extrahepatic biliary ductal dilation. Pancreas: Unremarkable Spleen: Unremarkable Adrenals/Urinary Tract: Adrenal glands are unremarkable. Kidneys are normal, without renal calculi, focal lesion, or hydronephrosis. Bladder is unremarkable. Stomach/Bowel: There is eccentric mural thickening involving the ascending colon just distal to the ileocecal junction and an eccentric mural mass is not excluded. There is no evidence of obstruction or pericolonic infiltration. The stomach, small bowel, and large bowel are otherwise unremarkable. The appendix is normal. No free intraperitoneal gas or fluid. Lymphatic: No pathologic adenopathy within the abdomen and pelvis. Reproductive: Lobulated appearance of the uterus may relate to underlying uterine fibroids. The pelvic organs are otherwise unremarkable. Other:  No abdominal wall hernia. Musculoskeletal: No acute bone abnormality. No lytic or blastic bone lesion. Review of the MIP images confirms the above findings. IMPRESSION: 1. No evidence of thoracic or abdominal aortic aneurysm or dissection. 2. 2.3 cm lobulated pulmonary nodule within the left lower lobe, suspicious for a primary pulmonary malignancy. Consider one of the following for both low-risk and high-risk individuals: (a) repeat chest CT in 3 months, (b) PET-CT, or (c) tissue sampling. This recommendation follows the consensus statement: Guidelines for Management of Incidental Pulmonary Nodules Detected on CT Images: From the Fleischner Society 2017; Radiology 2017; 284:228-243. 3. Multiple additional 3-5 mm noncalcified pulmonary nodules within the lungs bilaterally. These are indeterminate though their multiplicity favors the  sequela of a remote infectious or inflammatory process. No follow-up needed if patient is low-risk (and has no known or suspected primary neoplasm). Non-contrast chest CT can be considered in 12 months if patient is high-risk. This recommendation follows the consensus statement: Guidelines for Management of Incidental Pulmonary Nodules Detected on CT Images: From the Fleischner Society 2017; Radiology 2017; 284:228-243. 4. Eccentric mural thickening involving the ascending colon just distal to the ileocecal junction. While this may be related to underdistention, an eccentric mural mass is not excluded. Correlation with endoscopy is recommended. 5. Cholelithiasis with a 3.1 cm lamellated gallstone impacted within the gallbladder neck. The gallbladder is distended though no superimposed pericholecystic inflammatory changes are identified to suggest acute cholecystitis. If there is clinical concern for acute cholecystitis, right upper quadrant sonography may be helpful for further evaluation. 6. Mosaic attenuation of the pulmonary parenchyma in keeping with multifocal air trapping related to small airways disease. Electronically Signed   By: Fidela Salisbury M.D.   On: 08/20/2022 02:56   DG Chest 1 View  Result Date: 08/20/2022 CLINICAL DATA:  Chest pain and shortness of breath EXAM: CHEST  1 VIEW COMPARISON:  07/02/2021 FINDINGS: Heart size and pulmonary vascularity are normal. Bronchial wall thickening and bronchiectasis demonstrated in the left lower lung similar to prior study. No developing consolidation or edema. No pleural effusions. No pneumothorax. Mediastinal contours appear intact. IMPRESSION: Bronchiectasis and bronchial wall thickening in the left lower lung. No developing consolidation. Electronically Signed   By: Lucienne Capers M.D.   On: 08/20/2022 01:28    Procedures Procedures    Medications Ordered in ED Medications  enoxaparin (LOVENOX) injection 40 mg (has no administration in time  range)  sodium chloride flush (NS) 0.9 % injection 3 mL (has no administration in time range)  0.9 %  sodium chloride infusion (has no administration in time range)  acetaminophen (TYLENOL) tablet 650 mg (has no administration in time range)    Or  acetaminophen (TYLENOL) suppository 650 mg (has no administration in time range)  ondansetron (ZOFRAN) tablet 4 mg (has no administration in time range)    Or  ondansetron (ZOFRAN) injection 4 mg (has no administration in time range)  albuterol (PROVENTIL) (2.5 MG/3ML) 0.083% nebulizer solution 2.5 mg (has no administration in time range)  hydrALAZINE (APRESOLINE) injection 10 mg (has no administration in time range)  cefTRIAXone (ROCEPHIN) 2 g in sodium chloride 0.9 % 100 mL IVPB (has no administration in time range)  morphine (PF) 2 MG/ML injection 2 mg (has no administration in time range)  iohexol (OMNIPAQUE) 350 MG/ML injection 75 mL (75 mLs Intravenous Contrast Given 08/20/22 0230)  ondansetron (ZOFRAN) injection 4 mg (4 mg Intravenous Given 08/20/22 0259)  morphine (PF) 4 MG/ML injection 4 mg (4 mg Intravenous Given 08/20/22  0303)  cefTRIAXone (ROCEPHIN) 2 g in sodium chloride 0.9 % 100 mL IVPB (0 g Intravenous Stopped 08/20/22 0540)  morphine (PF) 4 MG/ML injection 4 mg (4 mg Intravenous Given 08/20/22 6546)    ED Course/ Medical Decision Making/ A&P Clinical Course as of 08/20/22 0740  Mon Aug 20, 2022  0552 Consult to Dr. Redmond Pulling, Who agrees with plan for medical admission and states that morning surgical team will consult on the patient, as  he is headed into the OR with another case. I appreciate his collaboration in the care of this patient.  [RS]  0620 Consult Dr. Myna Hidalgo, hospitalist, who is agreeable to admitting this patient to his service. I appreciate his collaboration in the care of this patient.  [RS]    Clinical Course User Index [RS] Aura Dials                           Medical Decision Making 56 year old  female presents concern for chest pain x3 days with associated nausea vomiting and radiation to the neck today.  Hypertensive and bradycardic on intake, tachypneic.  Vital signs otherwise normal.  Cardiac exam with bradycardic rate with regular rhythm.  Pulmonary exam is unremarkable.  Abdominal exam with exquisite epigastric and right upper quadrant tenderness to palpation with positive Murphy sign.  No CVAT.  No pitting lower extremity edema.  The differential diagnosis for RUQ includes but is not limited to: ACS, PE, Cholelithiasis / choledocholithiasis / cholecystitis / cholangitis, hepatitis (eg. viral, alcoholic, toxic),liver abscess, pancreatitis, liver / pancreatic / biliary tract cancer, ischemic hepatopathy (shock liver), hepatic vein obstruction (Budd-Chiari syndrome), liver cell adenoma, peptic ulcer disease (duodenal), functional or nonulcer dyspepsia, right lower lobe pneumonia, pyelonephritis, urinary calculi,  Fitz-Hugh-Curtis syndrome (with pelvic inflammatory disease), herpes zoster, trauma or musculoskeletal pain, herniated disk, abdominal abscess, intestinal ischemia, physical or sexual abuse, ectopic pregnancy, IUP, Mittelschmerz, ovarian cyst/torsion, threatened/ievitable abortion, PID, endometriosis, molar pregnancy, heterotopic pregnancy, corpus luteum cyst, appendicitis, UTI/renal colic, IBD.    Amount and/or Complexity of Data Reviewed Labs:     Details: CBC with white count of 13,000, no anemia.  CMP with creatinine of 1.05 at patient's baseline.  Troponin negative, 6, lipase is normal, 40.   Radiology: ordered.    Details: Chest x-ray with bronchiectasis and bronchial wall thickening in the left lower lung without consolidation, visualized this provider.  CTA with 3.1 cm lamellated gallstone impacted within the neck of the gallbladder wihtout superimposed inflammatory changes.  Patient also with 2.3 cm lobulated pulmonary nodule concerning for possible malignancy as well as  eccentric mural thickening in the ileocecal junction concerning for mural mass.  Multiple small pulmonary nodules likely representing inflammatory or postinfectious changes.  Risk Prescription drug management. Decision regarding hospitalization.   Clinical picture most consistent with pain associated with impacted gallstone identified on CT.  Will cover with antibiotics and proceed with RUQ ultrasound.  Patient require admission to the hospital for likely surgical management of her symptomatic impacted gallstone; will also benefit from admission to the hospital for further investigation into findings on CT today concerning for possible malignancies.  Patient admitted to hospitalist service as above, patient reevaluated and feeling much more comfortable after second dose of pain medication.  No further work-up is warranted in the ED at this time.  Patient is hemodynamically stable for admission.  Cylie  voiced understanding of her medical evaluation and treatment plan. Each of their questions answered to their expressed  satisfaction. She is amenable to plan for admission at this time.   This chart was dictated using voice recognition software, Dragon. Despite the best efforts of this provider to proofread and correct errors, errors may still occur which can change documentation meaning.  Final Clinical Impression(s) / ED Diagnoses Final diagnoses:  Impacted gallstone of gallbladder    Rx / DC Orders ED Discharge Orders     None         Aura Dials 08/20/22 0740    Quintella Reichert, MD 08/21/22 [id number]

## 2022-08-20 NOTE — H&P (Addendum)
History and Physical    Patient: Kendra Castro GUY:[id number] DOB: 10/07/1966 DOA: 08/20/2022 DOS: the patient was seen and examined on 08/20/2022 PCP: Pcp, No  Patient coming from: Home  Chief Complaint:  Chief Complaint  Patient presents with   Chest Pain   Shortness of Breath   HPI: Kendra Castro is a 56 y.o. female with medical history significant of hypertension, prediabetes, uterine fibroids, and obesity who presents with complaints of chest pain.  Patient has been in her normal state of health up until 2 days ago when she ate a granola bar.  Thereafter patient developed lower substernal pain that she felt epigastrically and in her right upper quadrant of her abdomen.  Pain was described as burning and pressure, but was intermittently stabbing in nature.  Symptoms seemed to radiate to her back, shoulder blades, and on the left side of her neck.  She tried drinking Pepto-Bismol to treat symptoms.  Noted associated subjective fever, clamminess, shortness of breath, nausea, vomiting, and soft/loose stools. Emesis was nonbloody and nonbilious in appearance.  Only reports having 1 bowel movement per day.  Denies seeing any blood in her stools, cough, weight loss, or dysuria.  She has never had a colonoscopy although advised by her primary before. Patient is never smoked cigarettes and does not drink alcohol.  Denies any family history of colon cancer, but her mom did die in her 55s from pancreatic cancer.  She does not currently take any medications.  Upon admission into the emergency department patient was seen to be afebrile with pulse 41-48, respirations 20-24, and all other vital signs maintained.  Labs significant for WBC 13.2.  High-sensitivity troponins x2, LFTs, and lipase within normal limits.  CT angiogram of the chest/abdomen/pelvis was obtained which noted 2.3 cm lobulated pulmonary nodule left lower lobe, multiple noncalcified pulmonary nodules within the bilateral lungs, mural  thickening of the ascending colon just distal to the ileocecal junction, cholelithiasis with a 3.1 cm laminated gallstone impacted within the gallbladder neck with distention of the gallbladder without clear signs of cholecystitis, and no signs of a dissection or aneurysm.  Ultrasound characterizes it as a 1.4 x 2.6 polypoid lesion in the apex of the gallbladder with flow demonstrated without signs of cholecystitis.  General surgery have been consulted.  Patient has been given antiemetics, morphine IV, and Rocephin.  TRH accepted placed order for observation to telemetry surgical bed.   Review of Systems: As mentioned in the history of present illness. All other systems reviewed and are negative. Past Medical History:  Diagnosis Date   Bell's palsy 2020   Constipation    Hypertension    Obesity    Uterine fibroid    Past Surgical History:  Procedure Laterality Date   CYST EXCISION     Lower back   Social History:  reports that she has never smoked. She has never used smokeless tobacco. She reports that she does not drink alcohol and does not use drugs.  No Known Allergies  Family History  Problem Relation Age of Onset   Hypertension Mother    Pancreatic cancer Mother    Hypertension Father    Diabetes Father     Prior to Admission medications   Medication Sig Start Date End Date Taking? Authorizing Provider  amLODipine (NORVASC) 10 MG tablet Take 1 tablet (10 mg total) by mouth daily. 01/03/18   Everrett Coombe, MD  cephALEXin [facility]) 500 MG capsule 2 caps po bid x 7 days 07/02/21   Milton Ferguson,  MD  ibuprofen (ADVIL) 800 MG tablet Take 1 tablet (800 mg total) by mouth every 8 (eight) hours as needed. 07/02/21   Milton Ferguson, MD  ibuprofen (ADVIL,MOTRIN) 800 MG tablet Take 1 tablet (800 mg total) by mouth every 8 (eight) hours as needed. 03/27/17   Anastasio Auerbach, MD    Physical Exam: Vitals:   08/20/22 0105 08/20/22 0240 08/20/22 0315 08/20/22 0510  BP: (!) 158/57 (!)  168/82  (!) 163/76  Pulse: (!) 48 (!) 47  (!) 41  Resp: (!) 22 (!) 24  20  Temp: 98.1 F (36.7 C)  98.8 F (37.1 C)   TempSrc: Oral  Oral   SpO2: 100% 100%  96%   Exam  Constitutional: Middle aged female currently in no acute distress Eyes: PERRL, lids and conjunctivae normal ENMT: Mucous membranes are moist.  Neck: normal, supple  Respiratory: clear to auscultation bilaterally, no wheezing, no crackles. Normal respiratory effort. No accessory muscle use.  Cardiovascular: Bradycardic, no murmurs / rubs / gallops. No extremity edema.   Abdomen: Some tenderness noted over the right upper quadrant and epigastrically.    Bowel sounds appreciated in all 4 quadrants. Musculoskeletal: no clubbing / cyanosis. No joint deformity upper and lower extremities.  Skin: no rashes, lesions, ulcers. No induration Neurologic: CN 2-12 grossly intact. Strength 5/5 in all 4.  Psychiatric: Normal judgment and insight. Alert and oriented x 3. Normal mood.   Data Reviewed:  EKG reveals sinus bradycardia at 40 bpm.  Reviewed labs, imaging and pertinent records as noted above.  Assessment and Plan: Impacted gallstone of gallbladder Acute.  Patient presented with complaints of abdominal pain, nausea, vomiting, and diarrhea.  LFTs noted to be within normal limits.  CT angiogram noted l cholelithiasis with 3.1 cm laminated gallstone impacted within the gallbladder neck with distention of the gallbladder without clear signs of cholecystitis.  Patient has been started on Rocephin 2 g IV.  General surgery have been formally consulted. -Admit to a telemetry bed -N.p.o. for needed procedure -Continue Rocephin IV -Antiemetics as needed -Morphine IV as needed for pain -Appreciate general surgery consultative services, we will follow-up for further recommendations  Leukocytosis Acute.  WBC elevated 13.2.  Does not meet SIRS criteria at this time.  Suspect secondary to above. -Check ESR and CRP -Recheck CBC  tomorrow  Sinus bradycardia Heart rates found to be in the 40s with blood pressures currently maintained.  She is not on any heart rate controlling medications.  Possibly related with pain medications -Follow-up telemetry  Essential hypertension Blood pressures initially elevated up to 172/63.  Patient reports not being on any medication for blood pressure. -Hydralazine IV as needed for elevated blood pressures  Pulmonary nodules Patient noted to have a lobulated 2.1 x 2.2 x 2.3 cm pulmonary nodule in the left lower lobe given concern for possibility of malignancy with multiple noncalcified pulmonary nodules bilaterally.  Recommendations for both low-risk and high-risk individuals include one of the following: (a) repeat chest CT in 3 months, (b) PET-CT, or (c) tissue sampling.  Other pulmonary nodules could be inflammatory versus infectious.  Patient has no history of tobacco abuse. -Pulmonology consulted in regards to lung lesion to help arrange follow-up  Mural thickening of colon Incidental finding on CT angiogram noted eccentric mural thickening involving the ascending colon just distal to the ileocecal junction.  Differentials included underdistention, but could not exclude the possibility of a mass.  Patient has never had a colonoscopy. -May warrant GI consultation in the  inpatient setting depending on findings during surgery  Prediabetes On admission glucose only elevated to 157.  Patient has history of prediabetes with last hemoglobin A1c 6 in 2019. -Continue to monitor glucose once.  Consider checking hemoglobin A1c and starting sliding scale insulin if glucose trends greater than 180.  History of gout No acute flare noted at this time.  Morbid obesity Last available BMI was around 42.91 kg/m   DVT prophylaxis: Lovenox Advance Care Planning:   Code Status: Full Code   Consults: General surgery, pulmonary  Family Communication: Patient declined need to update family at  this time  Severity of Illness: The appropriate patient status for this patient is OBSERVATION. Observation status is judged to be reasonable and necessary in order to provide the required intensity of service to ensure the patient's safety. The patient's presenting symptoms, physical exam findings, and initial radiographic and laboratory data in the context of their medical condition is felt to place them at decreased risk for further clinical deterioration. Furthermore, it is anticipated that the patient will be medically stable for discharge from the hospital within 2 midnights of admission.   Author: Norval Morton, MD 08/20/2022 7:03 AM  For on call review [email or web address].

## 2022-08-20 NOTE — Care Plan (Signed)
Triage called and asked we expedite this pt.'s CTA, CT waiting on Labs at this time and once they are back will get pt. Over to scan.

## 2022-08-20 NOTE — Consult Note (Signed)
NAME:  Kendra Castro, MRN:  [id number], DOB:  08-13-1966, LOS: 0 ADMISSION DATE:  08/20/2022, CONSULTATION DATE:  08/20/22 REFERRING MD:  Tamala Julian, CHIEF COMPLAINT:  Abd pain   History of Present Illness:  56 year old woman with a history of hypertension who is presenting with abdominal pain, chest pain, epigastric pain.  Found during scan to have acute cholecystitis and underwent laparoscopic cholecystectomy earlier today.  Also noted on the scan to have a pulmonary nodule for which pulmonary is consulted.  Patient is a non-smoker.  She has no weight loss, cough, dyspnea.  She has no prior history of cancer.  No unusual exposures.  Pertinent  Medical History  Hypertension Fibroids  Significant Hospital Events: Including procedures, antibiotic start and stop dates in addition to other pertinent events   10/9 admit, cholecystectomy  Interim History / Subjective:  Consulted  Objective   Blood pressure (!) 123/55, pulse (!) 57, temperature 98 F (36.7 C), temperature source Oral, resp. rate 16, height '5\' 4"'$  (1.626 m), weight 113.4 kg, last menstrual period 03/17/2017, SpO2 95 %.        Intake/Output Summary (Last 24 hours) at 08/20/2022 1446 Last data filed at 08/20/2022 1300 Gross per 24 hour  Intake 1889.64 ml  Output 20 ml  Net 1869.64 ml   Filed Weights   08/20/22 0905  Weight: 113.4 kg    Examination: General: No acute distress HENT: Mucous membranes are dry, trachea midline Lungs: Lungs are clear to auscultation bilaterally, no accessory muscle use Cardiovascular: Regular Abdomen: Soft, mildly tender to palpation, incision sites are dressed without strikethrough Extremities: Warm, no edema Neuro: Moves all 4 extremities command Psych: Aox3  CT reviewed: noncalcified nodules on left look benign, dominant LLL lobulated mass looks like would be c/w carcinoid  Resolved Hospital Problem list   N/A  Assessment & Plan:  Lung nodule in nonsmoker- warrants non-urgent  sampling.  Will route to [facility] partners to determine best timing and place information on DC paperwork.  Patient updated.  Available PRN.  Labs   CBC: Recent Labs  Lab 08/20/22 0108 08/20/22 0146  WBC 13.2*  --   NEUTROABS 10.6*  --   HGB 13.5 13.9  HCT 40.0 41.0  MCV 84.9  --   PLT 284  --     Basic Metabolic Panel: Recent Labs  Lab 08/20/22 0108 08/20/22 0146  NA 138 140  K 3.7 3.6  CL 103 101  CO2 25  --   GLUCOSE 157* 152*  BUN 10 10  CREATININE 1.05* 1.00  CALCIUM 8.9  --    GFR: Estimated Creatinine Clearance: 76.6 mL/min (by C-G formula based on SCr of 1 mg/dL). Recent Labs  Lab 08/20/22 0108  WBC 13.2*    Liver Function Tests: Recent Labs  Lab 08/20/22 0108  AST 23  ALT 14  ALKPHOS 58  BILITOT 1.1  PROT 7.6  ALBUMIN 3.8   Recent Labs  Lab 08/20/22 0108  LIPASE 40   No results for input(s): "AMMONIA" in the last 168 hours.  ABG    Component Value Date/Time   TCO2 28 08/20/2022 0146     Coagulation Profile: No results for input(s): "INR", "PROTIME" in the last 168 hours.  Cardiac Enzymes: No results for input(s): "CKTOTAL", "CKMB", "CKMBINDEX", "TROPONINI" in the last 168 hours.  HbA1C: Hemoglobin A1C  Date/Time Value Ref Range Status  01/03/2018 04:07 PM 6.0  Final   Hgb A1c MFr Bld  Date/Time Value Ref Range Status  03/27/2017 10:49 AM  5.9 (H) <5.7 % Final    Comment:      For someone without known diabetes, a hemoglobin A1c value between 5.7% and 6.4% is consistent with prediabetes and should be confirmed with a follow-up test.   For someone with known diabetes, a value <7% indicates that their diabetes is well controlled. A1c targets should be individualized based on duration of diabetes, age, co-morbid conditions and other considerations.   This assay result is consistent with an increased risk of diabetes.   Currently, no consensus exists regarding use of hemoglobin A1c for diagnosis of diabetes in children.      09/15/2015 12:06 PM 5.6 4.8 - 5.6 % Final    Comment:    (NOTE)         Pre-diabetes: 5.7 - 6.4         Diabetes: >6.4         Glycemic control for adults with diabetes: <7.0     CBG: No results for input(s): "GLUCAP" in the last 168 hours.  Review of Systems:    Positive Symptoms in bold:  Constitutional fevers, chills, weight loss, fatigue, anorexia, malaise  Eyes decreased vision, double vision, eye irritation  Ears, Nose, Mouth, Throat sore throat, trouble swallowing, sinus congestion  Cardiovascular chest pain, paroxysmal nocturnal dyspnea, lower ext edema, palpitations  Respiratory SOB, cough, DOE, hemoptysis, wheezing  Gastrointestinal nausea, vomiting, diarrhea  Genitourinary burning with urination, trouble urinating  Musculoskeletal joint aches, joint swelling, back pain  Integumentary  rashes, skin lesions  Neurological focal weakness, focal numbness, trouble speaking, headaches  Psychiatric depression, anxiety, confusion  Endocrine polyuria, polydipsia, cold intolerance, heat intolerance  Hematologic abnormal bruising, abnormal bleeding, unexplained nose bleeds  Allergic/Immunologic recurrent infections, hives, swollen lymph nodes     Past Medical History:  She,  has a past medical history of Bell's palsy (2020), Constipation, Hypertension, Obesity, and Uterine fibroid.   Surgical History:   Past Surgical History:  Procedure Laterality Date   CYST EXCISION     Lower back     Social History:   reports that she has never smoked. She has never used smokeless tobacco. She reports that she does not drink alcohol and does not use drugs.   Family History:  Her family history includes Diabetes in her father; Hypertension in her father and mother; Pancreatic cancer in her mother.   Allergies No Known Allergies   Home Medications  Prior to Admission medications   Medication Sig Start Date End Date Taking? Authorizing Provider  amLODipine (NORVASC) 10 MG  tablet Take 1 tablet (10 mg total) by mouth daily. 01/03/18   Everrett Coombe, MD  ibuprofen (ADVIL) 800 MG tablet Take 1 tablet (800 mg total) by mouth every 8 (eight) hours as needed. 07/02/21   Milton Ferguson, MD

## 2022-08-20 NOTE — Discharge Instructions (Signed)
[facility] LAPAROSCOPIC SURGERY: POST OP INSTRUCTIONS Always review your discharge instruction sheet given to you by the facility where your surgery was performed. IF YOU HAVE DISABILITY OR FAMILY LEAVE FORMS, YOU MUST BRING THEM TO THE OFFICE FOR PROCESSING.   DO NOT GIVE THEM TO YOUR DOCTOR.  PAIN CONTROL  First take acetaminophen (Tylenol) AND/or ibuprofen (Advil) to control your pain after surgery.  Follow directions on package.  Taking acetaminophen (Tylenol) and/or ibuprofen (Advil) regularly after surgery will help to control your pain and lower the amount of prescription pain medication you may need.  You should not take more than 3,000 mg (3 grams) of acetaminophen (Tylenol) in 24 hours.  You should not take ibuprofen (Advil), aleve, motrin, naprosyn or other NSAIDS if you have a history of stomach ulcers or chronic kidney disease.  A prescription for pain medication may be given to you upon discharge.  Take your pain medication as prescribed, if you still have uncontrolled pain after taking acetaminophen (Tylenol) or ibuprofen (Advil). Use ice packs to help control pain. If you need a refill on your pain medication, please contact your pharmacy.  They will contact our office to request authorization. Prescriptions will not be filled after 5pm or on week-ends.  HOME MEDICATIONS Take your usually prescribed medications unless otherwise directed.  DIET You should follow a light diet the first few days after arrival home.  Be sure to include lots of fluids daily. Avoid fatty, fried foods.   CONSTIPATION It is common to experience some constipation after surgery and if you are taking pain medication.  Increasing fluid intake and taking a stool softener (such as Colace) will usually help or prevent this problem from occurring.  A mild laxative (Milk of Magnesia or Miralax) should be taken according to package instructions if there are no bowel movements after 48  hours.  WOUND/INCISION CARE Most patients will experience some swelling and bruising in the area of the incisions.  Ice packs will help.  Swelling and bruising can take several days to resolve.  Unless discharge instructions indicate otherwise, follow guidelines below  STERI-STRIPS - you may remove your outer bandages 48 hours after surgery, and you may shower at that time.  You have steri-strips (small skin tapes) in place directly over the incision.  These strips should be left on the skin for 7-10 days.   DERMABOND/SKIN GLUE - you may shower in 24 hours.  The glue will flake off over the next 2-3 weeks. Any sutures or staples will be removed at the office during your follow-up visit.  ACTIVITIES You may resume regular (light) daily activities beginning the next day--such as daily self-care, walking, climbing stairs--gradually increasing activities as tolerated.  You may have sexual intercourse when it is comfortable.  Refrain from any heavy lifting or straining until approved by your doctor. You may drive when you are no longer taking prescription pain medication, you can comfortably wear a seatbelt, and you can safely maneuver your car and apply brakes.  FOLLOW-UP You should see your doctor in the office for a follow-up appointment approximately 2-3 weeks after your surgery.  You should have been given your post-op/follow-up appointment when your surgery was scheduled.  If you did not receive a post-op/follow-up appointment, make sure that you call for this appointment within a day or two after you arrive home to insure a convenient appointment time.  OTHER INSTRUCTIONS   WHEN TO CALL YOUR DOCTOR: Fever over 101.0 Inability to urinate Continued   bleeding from incision. Increased pain, redness, or drainage from the incision. Increasing abdominal pain  The clinic staff is available to answer your questions during regular business hours.  Please don't hesitate to call and ask to speak to  one of the nurses for clinical concerns.  If you have a medical emergency, go to the nearest emergency room or call 911.  A surgeon from [facility] is always on call at the hospital. [address], [address], [place], [place]  [address] ? [address] [address], [place], [place]   [address] [phone] ? [phone] ? FAX [phone] Web site: [email or web address]  

## 2022-08-20 NOTE — Consult Note (Signed)
Reason for Consult:Abdominal pain, nausea, vomiting Referring Physician: Dr. Fuller Plan  Kendra Castro is an 56 y.o. female.  HPI: This is a 56 year old female with hypertension, obesity, and prediabetes who presents with over a year of intermittent postprandial digestive symptoms.  She presents with 24 hours of acute onset of epigastric/right upper quadrant abdominal pain with radiation through to her back.  This is associated with nausea, vomiting, and diarrhea.  She presented to the emergency department for evaluation.  Cardiac and aortic dissection etiologies were ruled out.  She was noted to have a 3.1 cm stone lodged in the neck of her gallbladder with no radiologic evidence of acute cholecystitis.  Incidentally, she also has a 2.3 cm pulmonary nodule in the left lower lobe suspicious for primary malignancy.  She has multiple scattered 3 to 5 mm nodules in both lungs.  This is felt to favor infectious or inflammatory process.  Another incidental finding is some eccentric mural thickening of the ascending colon.  The patient has never had a colonoscopy.  No family history of colon cancer.  She denies any hematochezia or melena.  Past Medical History:  Diagnosis Date   Bell's palsy 2020   Constipation    Hypertension    Obesity    Uterine fibroid     Past Surgical History:  Procedure Laterality Date   CYST EXCISION     Lower back    Family History  Problem Relation Age of Onset   Hypertension Father    Diabetes Father    Hypertension Mother     Social History:  reports that she has never smoked. She has never used smokeless tobacco. She reports that she does not drink alcohol and does not use drugs.  Allergies: No Known Allergies  Medications:  Prior to Admission medications   Medication Sig Start Date End Date Taking? Authorizing Provider  amLODipine (NORVASC) 10 MG tablet Take 1 tablet (10 mg total) by mouth daily. 01/03/18   Everrett Coombe, MD  cephALEXin (KEFLEX) 500 MG  capsule 2 caps po bid x 7 days 07/02/21   Milton Ferguson, MD  ibuprofen (ADVIL) 800 MG tablet Take 1 tablet (800 mg total) by mouth every 8 (eight) hours as needed. 07/02/21   Milton Ferguson, MD  ibuprofen (ADVIL,MOTRIN) 800 MG tablet Take 1 tablet (800 mg total) by mouth every 8 (eight) hours as needed. 03/27/17   Fontaine, Belinda Block, MD     Results for orders placed or performed during the hospital encounter of 08/20/22 (from the past 48 hour(s))  CBC with Differential     Status: Abnormal   Collection Time: 08/20/22  1:08 AM  Result Value Ref Range   WBC 13.2 (H) 4.0 - 10.5 K/uL   RBC 4.71 3.87 - 5.11 MIL/uL   Hemoglobin 13.5 12.0 - 15.0 g/dL   HCT 40.0 36.0 - 46.0 %   MCV 84.9 80.0 - 100.0 fL   MCH 28.7 26.0 - 34.0 pg   MCHC 33.8 30.0 - 36.0 g/dL   RDW 13.3 11.5 - 15.5 %   Platelets 284 150 - 400 K/uL   nRBC 0.0 0.0 - 0.2 %   Neutrophils Relative % 79 %   Neutro Abs 10.6 (H) 1.7 - 7.7 K/uL   Lymphocytes Relative 15 %   Lymphs Abs 1.9 0.7 - 4.0 K/uL   Monocytes Relative 3 %   Monocytes Absolute 0.4 0.1 - 1.0 K/uL   Eosinophils Relative 1 %   Eosinophils Absolute 0.1 0.0 - 0.5  K/uL   Basophils Relative 1 %   Basophils Absolute 0.1 0.0 - 0.1 K/uL   Immature Granulocytes 1 %   Abs Immature Granulocytes 0.12 (H) 0.00 - 0.07 K/uL    Comment: Performed at [facility], [place] [address], [place], [place] [address]  Comprehensive metabolic panel     Status: Abnormal   Collection Time: 08/20/22  1:08 AM  Result Value Ref Range   Sodium 138 135 - 145 mmol/L   Potassium 3.7 3.5 - 5.1 mmol/L   Chloride 103 98 - 111 mmol/L   CO2 25 22 - 32 mmol/L   Glucose, Bld 157 (H) 70 - 99 mg/dL    Comment: Glucose reference range applies only to samples taken after fasting for at least 8 hours.   BUN 10 6 - 20 mg/dL   Creatinine, Ser 1.05 (H) 0.44 - 1.00 mg/dL   Calcium 8.9 8.9 - 10.3 mg/dL   Total Protein 7.6 6.5 - 8.1 g/dL   Albumin 3.8 3.5 - 5.0 g/dL   AST 23 15 - 41 U/L   ALT 14 0 - 44  U/L   Alkaline Phosphatase 58 38 - 126 U/L   Total Bilirubin 1.1 0.3 - 1.2 mg/dL   GFR, Estimated >60 >60 mL/min    Comment: (NOTE) Calculated using the CKD-EPI Creatinine Equation (2021)    Anion gap 10 5 - 15    Comment: Performed at [facility] [address], [place], [place] [address]  Lipase, blood     Status: None   Collection Time: 08/20/22  1:08 AM  Result Value Ref Range   Lipase 40 11 - 51 U/L    Comment: Performed at [facility] [address] [place], [place] [address]  Troponin I (High Sensitivity)     Status: None   Collection Time: 08/20/22  1:08 AM  Result Value Ref Range   Troponin I (High Sensitivity) 6 <18 ng/L    Comment: (NOTE) Elevated high sensitivity troponin I (hsTnI) values and significant  changes across serial measurements may suggest ACS but many other  chronic and acute conditions are known to elevate hsTnI results.  Refer to the "Links" section for chest pain algorithms and additional  guidance. Performed at [facility], [place] [address] [place],  [address]   I-stat chem 8, ED (not at [facility] or [facility])     Status: Abnormal   Collection Time: 08/20/22  1:46 AM  Result Value Ref Range   Sodium 140 135 - 145 mmol/L   Potassium 3.6 3.5 - 5.1 mmol/L   Chloride 101 98 - 111 mmol/L   BUN 10 6 - 20 mg/dL   Creatinine, Ser 1.00 0.44 - 1.00 mg/dL   Glucose, Bld 152 (H) 70 - 99 mg/dL    Comment: Glucose reference range applies only to samples taken after fasting for at least 8 hours.   Calcium, Ion 1.14 (L) 1.15 - 1.40 mmol/L   TCO2 28 22 - 32 mmol/L   Hemoglobin 13.9 12.0 - 15.0 g/dL   HCT 41.0 36.0 - 46.0 %  Troponin I (High Sensitivity)     Status: None   Collection Time: 08/20/22  2:54 AM  Result Value Ref Range   Troponin I (High Sensitivity) 6 <18 ng/L    Comment: (NOTE) Elevated high sensitivity troponin I (hsTnI) values and significant  changes across serial measurements may suggest ACS but many other  chronic and  acute conditions are known  to elevate hsTnI results.  Refer to the "Links" section for chest pain algorithms and additional  guidance. Performed at [facility], [place] [address] [place], [place] [address]     US Abdomen Limited RUQ (LIVER/GB)  Result Date: 08/20/2022 CLINICAL DATA:  Right upper quadrant pain. EXAM: ULTRASOUND ABDOMEN LIMITED RIGHT UPPER QUADRANT COMPARISON:  CT 08/20/2022 FINDINGS: Gallbladder: Cholelithiasis with 2.9 cm diameter stone in the gallbladder neck. Sludge in the gallbladder. Focal polypoid lesion at the apex of the gallbladder measuring 1.4 x 2.6 cm diameter and containing central flow on color flow Doppler imaging. This could represent neoplasm and based on size criteria, hepatic MRI is suggested for further evaluation. No gallbladder wall thickening or edema is otherwise identified. Murphy's sign is negative. Common bile duct: Diameter: 6 mm, normal Liver: Increased parenchymal echotexture of the liver suggesting fatty infiltration. No focal mass identified. Portal vein is patent on color Doppler imaging with normal direction of blood flow towards the liver. Other: Incidental note of a 6 mm hyperechoic focus in the upper pole of the right kidney likely representing an angiomyolipoma. No shadowing is identified to suggest a stone. IMPRESSION: 1. Cholelithiasis without additional changes to suggest acute cholecystitis. 2. 1.4 x 2.6 cm polypoid lesion in the apex of the gallbladder with flow demonstrated. MRI is suggested for further characterization in the elective setting. 3. Diffuse fatty infiltration of the liver. 4. Hyperechoic lesion in the upper pole of the right kidney likely represents angiomyolipoma. Nonshadowing stone would be a secondary possibility. Electronically Signed   By: Lucienne Capers M.D.   On: 08/20/2022 04:00   CT Angio Chest/Abd/Pel for Dissection W and/or Wo Contrast  Result Date: 08/20/2022 CLINICAL DATA:  Chest pain or back pain, aortic  dissection suspected. Dyspnea, nausea, vomiting, diarrhea. EXAM: CT ANGIOGRAPHY CHEST, ABDOMEN AND PELVIS TECHNIQUE: Non-contrast CT of the chest was initially obtained. Multidetector CT imaging through the chest, abdomen and pelvis was performed using the standard protocol during bolus administration of intravenous contrast. Multiplanar reconstructed images and MIPs were obtained and reviewed to evaluate the vascular anatomy. RADIATION DOSE REDUCTION: This exam was performed according to the departmental dose-optimization program which includes automated exposure control, adjustment of the mA and/or kV according to patient size and/or use of iterative reconstruction technique. CONTRAST:  53m OMNIPAQUE IOHEXOL 350 MG/ML SOLN COMPARISON:  None Available. FINDINGS: CTA CHEST FINDINGS Cardiovascular: Preferential opacification of the thoracic aorta. No evidence of thoracic aortic aneurysm or dissection. Normal heart size. No pericardial effusion. Mediastinum/Nodes: Visualized thyroid is unremarkable. No pathologic thoracic adenopathy. Esophagus unremarkable. Lungs/Pleura: Lobulated pulmonary nodule is seen within the left lower lobe measuring 2.1 x 2.2 x 2.3 cm at axial image # 95/8 and coronal image # 115/9. The mass occludes a subsegmental bronchus of the posterior basal segment of the left lower lobe. 6 mm mean diameter noncalcified pulmonary nodule within the right middle lobe, axial image # 92, series 8, indeterminate. Numerous 3-5 mm noncalcified pulmonary nodules are identified within the left lower lobe at axial image # 72, left upper lobe at axial image # 72, # 71, and # 54, and within the right upper lobe at axial image # 68, series 8. These are indeterminate though their multiplicity favors the sequela of a remote infectious or inflammatory process. Mosaic attenuation of the pulmonary parenchyma is in keeping with multifocal air trapping related to probable small airways disease. No pneumothorax or pleural  effusion. Central airways are widely patent. Musculoskeletal: No acute bone abnormality. Healed fracture  deformity of the mid diaphysis of the left clavicle. No lytic or blastic bone lesion. Review of the MIP images confirms the above findings. CTA ABDOMEN AND PELVIS FINDINGS VASCULAR Aorta: Normal caliber aorta without aneurysm, dissection, vasculitis or significant stenosis. Celiac: Patent without evidence of aneurysm, dissection, vasculitis or significant stenosis. SMA: Patent without evidence of aneurysm, dissection, vasculitis or significant stenosis. Renals: Dual renal arteries bilaterally. Wide patency. Normal vascular morphology. No aneurysm or dissection. IMA: Patent without evidence of aneurysm, dissection, vasculitis or significant stenosis. Inflow: Patent without evidence of aneurysm, dissection, vasculitis or significant stenosis. Veins: No obvious venous abnormality within the limitations of this arterial phase study. Review of the MIP images confirms the above findings. NON-VASCULAR Hepatobiliary: 3.1 cm lamellated gallstone is seen impacted within the a gallbladder neck. The gallbladder is distended. No superimposed pericholecystic inflammatory changes, however, are identified to suggest changes of acute cholecystitis. Liver unremarkable. No intra or extrahepatic biliary ductal dilation. Pancreas: Unremarkable Spleen: Unremarkable Adrenals/Urinary Tract: Adrenal glands are unremarkable. Kidneys are normal, without renal calculi, focal lesion, or hydronephrosis. Bladder is unremarkable. Stomach/Bowel: There is eccentric mural thickening involving the ascending colon just distal to the ileocecal junction and an eccentric mural mass is not excluded. There is no evidence of obstruction or pericolonic infiltration. The stomach, small bowel, and large bowel are otherwise unremarkable. The appendix is normal. No free intraperitoneal gas or fluid. Lymphatic: No pathologic adenopathy within the abdomen and  pelvis. Reproductive: Lobulated appearance of the uterus may relate to underlying uterine fibroids. The pelvic organs are otherwise unremarkable. Other: No abdominal wall hernia. Musculoskeletal: No acute bone abnormality. No lytic or blastic bone lesion. Review of the MIP images confirms the above findings. IMPRESSION: 1. No evidence of thoracic or abdominal aortic aneurysm or dissection. 2. 2.3 cm lobulated pulmonary nodule within the left lower lobe, suspicious for a primary pulmonary malignancy. Consider one of the following for both low-risk and high-risk individuals: (a) repeat chest CT in 3 months, (b) PET-CT, or (c) tissue sampling. This recommendation follows the consensus statement: Guidelines for Management of Incidental Pulmonary Nodules Detected on CT Images: From the Fleischner Society 2017; Radiology 2017; 284:228-243. 3. Multiple additional 3-5 mm noncalcified pulmonary nodules within the lungs bilaterally. These are indeterminate though their multiplicity favors the sequela of a remote infectious or inflammatory process. No follow-up needed if patient is low-risk (and has no known or suspected primary neoplasm). Non-contrast chest CT can be considered in 12 months if patient is high-risk. This recommendation follows the consensus statement: Guidelines for Management of Incidental Pulmonary Nodules Detected on CT Images: From the Fleischner Society 2017; Radiology 2017; 284:228-243. 4. Eccentric mural thickening involving the ascending colon just distal to the ileocecal junction. While this may be related to underdistention, an eccentric mural mass is not excluded. Correlation with endoscopy is recommended. 5. Cholelithiasis with a 3.1 cm lamellated gallstone impacted within the gallbladder neck. The gallbladder is distended though no superimposed pericholecystic inflammatory changes are identified to suggest acute cholecystitis. If there is clinical concern for acute cholecystitis, right upper  quadrant sonography may be helpful for further evaluation. 6. Mosaic attenuation of the pulmonary parenchyma in keeping with multifocal air trapping related to small airways disease. Electronically Signed   By: Fidela Salisbury M.D.   On: 08/20/2022 02:56   DG Chest 1 View  Result Date: 08/20/2022 CLINICAL DATA:  Chest pain and shortness of breath EXAM: CHEST  1 VIEW COMPARISON:  07/02/2021 FINDINGS: Heart size and pulmonary vascularity are normal. Bronchial wall  thickening and bronchiectasis demonstrated in the left lower lung similar to prior study. No developing consolidation or edema. No pleural effusions. No pneumothorax. Mediastinal contours appear intact. IMPRESSION: Bronchiectasis and bronchial wall thickening in the left lower lung. No developing consolidation. Electronically Signed   By: Lucienne Capers M.D.   On: 08/20/2022 01:28    Review of Systems  HENT:  Negative for ear discharge, ear pain, hearing loss and tinnitus.   Eyes:  Negative for photophobia and pain.  Respiratory:  Negative for cough and shortness of breath.   Cardiovascular:  Negative for chest pain.  Gastrointestinal:  Positive for abdominal pain, diarrhea, nausea and vomiting.  Genitourinary:  Negative for dysuria, flank pain, frequency and urgency.  Musculoskeletal:  Negative for back pain, myalgias and neck pain.  Neurological:  Negative for dizziness and headaches.  Hematological:  Does not bruise/bleed easily.  Psychiatric/Behavioral:  The patient is not nervous/anxious.    Blood pressure (!) 163/76, pulse (!) 41, temperature 98.8 F (37.1 C), temperature source Oral, resp. rate 20, last menstrual period 03/17/2017, SpO2 96 %. Physical Exam Constitutional:  WDWN in NAD, conversant, no obvious deformities; lying in bed comfortably Eyes:  Pupils equal, round; sclera anicteric; moist conjunctiva; no lid lag HENT:  Oral mucosa moist; good dentition  Neck:  No masses palpated, trachea midline; no thyromegaly Lungs:   CTA bilaterally; normal respiratory effort CV:  Regular rate and rhythm; no murmurs; extremities well-perfused with no edema Abd:  +bowel sounds, obese, tender in epigastrium and RUQ; no palpable organomegaly; no palpable hernias Musc:  Unable to assess gait; no apparent clubbing or cyanosis in extremities Lymphatic:  No palpable cervical or axillary lymphadenopathy Skin:  Warm, dry; no sign of jaundice Psychiatric - alert and oriented x 4; calm mood and affect  Assessment/Plan: Acute calculus cholecystitis Gallbladder polyp Left lower lobe pulmonary nodule Possible thickening of the ascending colon - cannot rule out mass on CT scan  Recommendations: Since the patient's current symptoms seem to be directly related to her gallbladder, would recommend proceeding with urgent laparoscopic cholecystectomy with intraoperative cholangiogram today.The surgical procedure has been discussed with the patient.  Potential risks, benefits, alternative treatments, and expected outcomes have been explained.  All of the patient's questions at this time have been answered.  The likelihood of reaching the patient's treatment goal is good.  The patient understand the proposed surgical procedure and wishes to proceed.   During surgery, we will examine the serosal surfaces of her right colon.  If there is any suspicion for possible mass in the right colon, we will proceed with bowel prep and consult GI for colonoscopy during this hospital admission.  Would also recommend asking pulmonary to see the patient to plan evaluation of the left lower lobe pulmonary nodule.  Imogene Burn. Georgette Dover, MD, [facility] Surgery  General Surgery   08/20/2022 8:11 AM    Imogene Burn Kendra Castro 08/20/2022, 7:57 AM

## 2022-08-20 NOTE — Op Note (Signed)
Laparoscopic Cholecystectomy with IOC/ diagnostic laparoscopy Procedure Note  Indications: This patient presents with acute cholecystitis.  Her CT scan also questioned some thickening of the proximal right colon.  She presents now for laparoscopic cholecystectomy and diagnostic laparoscopy.    Pre-operative Diagnosis: Calculus of gallbladder with acute cholecystitis, without mention of obstruction  Post-operative Diagnosis: Same  Surgeon: Maia Petties   Assistants: Precious Gilding, PA-S  Anesthesia: General endotracheal anesthesia  ASA Class: 2E  Procedure Details  The patient was seen again in the Holding Room. The risks, benefits, complications, treatment options, and expected outcomes were discussed with the patient. The possibilities of reaction to medication, pulmonary aspiration, perforation of viscus, bleeding, recurrent infection, finding a normal gallbladder, the need for additional procedures, failure to diagnose a condition, the possible need to convert to an open procedure, and creating a complication requiring transfusion or operation were discussed with the patient. The likelihood of improving the patient's symptoms with return to their baseline status is good.  The patient and/or family concurred with the proposed plan, giving informed consent. The site of surgery properly noted. The patient was taken to Operating Room, identified as Arianny Pun and the procedure verified as Laparoscopic Cholecystectomy with Intraoperative Cholangiogram. A Time Out was held and the above information confirmed.  Prior to the induction of general anesthesia, antibiotic prophylaxis was administered. General endotracheal anesthesia was then administered and tolerated well. After the induction, the abdomen was prepped with Chloraprep and draped in the sterile fashion. The patient was positioned in the supine position.  Local anesthetic agent was injected into the skin above the umbilicus and an  incision made. We dissected down to the abdominal fascia with blunt dissection.  The fascia was incised vertically and we entered the peritoneal cavity bluntly.  A pursestring suture of 0-Vicryl was placed around the fascial opening.  The Hasson cannula was inserted and secured with the stay suture.  Pneumoperitoneum was then created with CO2 and tolerated well without any adverse changes in the patient's vital signs. An 11-mm port was placed in the subxiphoid position.  Two 5-mm ports were placed in the right upper quadrant. All skin incisions were infiltrated with a local anesthetic agent before making the incision and placing the trocars.   We positioned the patient in reverse Trendelenburg, tilted slightly to the patient's left.  The gallbladder was identified.  The gallbladder was quite inflamed and distended.  We decompressed the gallbladder with suction aspirator.  The fundus was grasped and retracted cephalad. Adhesions were lysed bluntly and with the electrocautery where indicated, taking care not to injure any adjacent organs or viscus. The infundibulum was grasped and retracted laterally, exposing the peritoneum overlying the triangle of Calot. This was then divided and exposed in a blunt fashion. A critical view of the cystic duct and cystic artery was obtained.  The cystic duct was clearly identified and bluntly dissected circumferentially. The cystic duct was ligated with a clip distally.   An incision was made in the cystic duct and the [facility] cholangiogram catheter introduced. The catheter was secured using a clip. A cholangiogram was then obtained which showed good visualization of the distal and proximal biliary tree with no sign of filling defects or obstruction.  Contrast flowed easily into the duodenum. The catheter was then removed.   The cystic duct was then ligated with clips and divided. The cystic artery was identified, dissected free, ligated with clips and divided as well.   The  gallbladder was dissected from the  liver bed in retrograde fashion with the electrocautery. The gallbladder was removed and placed in an Eco sac. The liver bed was irrigated and inspected. Hemostasis was achieved with the electrocautery. Copious irrigation was utilized and was repeatedly aspirated until clear.  The gallbladder and Eco sac were then removed through the umbilical port site.  We had to enlarge the fascial opening to allow removal of the gallbladder containing the large gallstone.  We then reinserted the Hasson cannula.    We then examined the right colon.  I began at the hepatic flexure and examined the right colon all the way down to the cecum and terminal ileum.  There is no visible thickening of any of the right colon.  When I palpate the colon with the Glassman clamp, I do not feel any masses in the lumen of the right colon.  The appendix appears normal.  There is no sign of inflammation.    We again inspected the right upper quadrant for hemostasis.  Pneumoperitoneum was released as we removed the trocars.  The pursestring suture was used to close the umbilical fascia.  We completed our fascial closure with an additional 0 Vicryl.  4-0 Monocryl was used to close the skin.   Benzoin, steri-strips, and clean dressings were applied. The patient was then extubated and brought to the recovery room in stable condition. Instrument, sponge, and needle counts were correct at closure and at the conclusion of the case.   Findings: Cholecystitis with Cholelithiasis  Estimated Blood Loss: Minimal         Drains: none         Specimens: Gallbladder           Complications: None; patient tolerated the procedure well.         Disposition: PACU - hemodynamically stable.         Condition: stable  Imogene Burn. Georgette Dover, MD, [facility] Surgery  General Surgery   08/20/2022 10:48 AM

## 2022-08-20 NOTE — Anesthesia Preprocedure Evaluation (Signed)
Anesthesia Evaluation  Patient identified by MRN, date of birth, ID band Patient awake    Reviewed: Allergy & Precautions, H&P , NPO status , Patient's Chart, lab work & pertinent test results  Airway Mallampati: II   Neck ROM: full    Dental   Pulmonary neg pulmonary ROS,    breath sounds clear to auscultation       Cardiovascular hypertension,  Rhythm:regular Rate:Normal     Neuro/Psych  Neuromuscular disease    GI/Hepatic cholelithiasis   Endo/Other    Renal/GU      Musculoskeletal   Abdominal   Peds  Hematology   Anesthesia Other Findings   Reproductive/Obstetrics                             Anesthesia Physical Anesthesia Plan  ASA: 2  Anesthesia Plan: General   Post-op Pain Management:    Induction: Intravenous  PONV Risk Score and Plan: 3 and Ondansetron, Dexamethasone, Midazolam and Treatment may vary due to age or medical condition  Airway Management Planned: Oral ETT  Additional Equipment:   Intra-op Plan:   Post-operative Plan: Extubation in OR  Informed Consent: I have reviewed the patients History and Physical, chart, labs and discussed the procedure including the risks, benefits and alternatives for the proposed anesthesia with the patient or authorized representative who has indicated his/her understanding and acceptance.     Dental advisory given  Plan Discussed with: CRNA, Anesthesiologist and Surgeon  Anesthesia Plan Comments:         Anesthesia Quick Evaluation

## 2022-08-21 ENCOUNTER — Encounter (HOSPITAL_COMMUNITY): Payer: Self-pay | Admitting: Surgery

## 2022-08-21 DIAGNOSIS — K8020 Calculus of gallbladder without cholecystitis without obstruction: Secondary | ICD-10-CM | POA: Diagnosis not present

## 2022-08-21 LAB — COMPREHENSIVE METABOLIC PANEL
ALT: 21 U/L (ref 0–44)
AST: 39 U/L (ref 15–41)
Albumin: 3.3 g/dL — ABNORMAL LOW (ref 3.5–5.0)
Alkaline Phosphatase: 52 U/L (ref 38–126)
Anion gap: 9 (ref 5–15)
BUN: 14 mg/dL (ref 6–20)
CO2: 25 mmol/L (ref 22–32)
Calcium: 8.6 mg/dL — ABNORMAL LOW (ref 8.9–10.3)
Chloride: 102 mmol/L (ref 98–111)
Creatinine, Ser: 1.19 mg/dL — ABNORMAL HIGH (ref 0.44–1.00)
GFR, Estimated: 53 mL/min — ABNORMAL LOW (ref >60)
Glucose, Bld: 147 mg/dL — ABNORMAL HIGH (ref 70–99)
Potassium: 3.9 mmol/L (ref 3.5–5.1)
Sodium: 136 mmol/L (ref 135–145)
Total Bilirubin: 0.6 mg/dL (ref 0.3–1.2)
Total Protein: 6.9 g/dL (ref 6.5–8.1)

## 2022-08-21 LAB — URINALYSIS, ROUTINE W REFLEX MICROSCOPIC
Bilirubin Urine: NEGATIVE
Glucose, UA: NEGATIVE mg/dL
Hgb urine dipstick: NEGATIVE
Ketones, ur: NEGATIVE mg/dL
Nitrite: NEGATIVE
Protein, ur: NEGATIVE mg/dL
Specific Gravity, Urine: 1.021 (ref 1.005–1.030)
pH: 5 (ref 5.0–8.0)

## 2022-08-21 LAB — CBC
HCT: 37.4 % (ref 36.0–46.0)
Hemoglobin: 12.4 g/dL (ref 12.0–15.0)
MCH: 28.6 pg (ref 26.0–34.0)
MCHC: 33.2 g/dL (ref 30.0–36.0)
MCV: 86.4 fL (ref 80.0–100.0)
Platelets: 252 10*3/uL (ref 150–400)
RBC: 4.33 MIL/uL (ref 3.87–5.11)
RDW: 13.3 % (ref 11.5–15.5)
WBC: 17.7 10*3/uL — ABNORMAL HIGH (ref 4.0–10.5)
nRBC: 0 % (ref 0.0–0.2)

## 2022-08-21 LAB — SURGICAL PATHOLOGY

## 2022-08-21 MED ORDER — POLYETHYLENE GLYCOL 3350 17 G PO PACK
17.0000 g | PACK | Freq: Every day | ORAL | Status: DC
  Administered 2022-08-21 – 2022-08-22 (×2): 17 g via ORAL
  Filled 2022-08-21 (×3): qty 1

## 2022-08-21 NOTE — Progress Notes (Signed)
[facility] Progress Note  1 Day Post-Op  Subjective: CC: Patient seen laying bed. States her pain has improved a lot from pre-operatively. She notes soreness in her abdomen, mostly with movement, and coughing but it is manageable. She is eating, drinking, and urinating without incident. She has not yet had a bowel movement.   Objective: Vital signs in last 24 hours: Temp:  [97.8 F (36.6 C)-99.2 F (37.3 C)] 97.8 F (36.6 C) (10/10 0923) Pulse Rate:  [46-69] 49 (10/10 0923) Resp:  [11-23] 18 (10/10 0923) BP: (111-150)/(46-73) 124/59 (10/10 0923) SpO2:  [89 %-100 %] 96 % (10/10 0923) Last BM Date : 08/20/22  Intake/Output from previous day: 10/09 0701 - 10/10 0700 In: 2717.5 [P.O.:240; I.V.:2227.5; IV Piggyback:50] Out: 20 [Blood:20] Intake/Output this shift: No intake/output data recorded.  PE: Gen:  Alert, NAD, pleasant Card:  Regular rate and rhythm, pedal pulses 2+ BL Pulm:  Normal effort, clear to auscultation bilaterally Abd: Soft, non-tender, non-distended, bowel sounds present in all 4 quadrants, no HSM, incisions C/D/I Skin: warm and dry, no rashes  Psych: A&Ox3   Lab Results:  Recent Labs    08/20/22 0108 08/20/22 0146 08/21/22 0043  WBC 13.2*  --  17.7*  HGB 13.5 13.9 12.4  HCT 40.0 41.0 37.4  PLT 284  --  252   BMET Recent Labs    08/20/22 0108 08/20/22 0146 08/21/22 0043  NA 138 140 136  K 3.7 3.6 3.9  CL 103 101 102  CO2 25  --  25  GLUCOSE 157* 152* 147*  BUN '10 10 14  '$ CREATININE 1.05* 1.00 1.19*  CALCIUM 8.9  --  8.6*   PT/INR No results for input(s): "LABPROT", "INR" in the last 72 hours. CMP     Component Value Date/Time   NA 136 08/21/2022 0043   K 3.9 08/21/2022 0043   CL 102 08/21/2022 0043   CO2 25 08/21/2022 0043   GLUCOSE 147 (H) 08/21/2022 0043   BUN 14 08/21/2022 0043   CREATININE 1.19 (H) 08/21/2022 0043   CREATININE 1.05 03/27/2017 1049   CALCIUM 8.6 (L) 08/21/2022 0043   PROT 6.9 08/21/2022 0043    ALBUMIN 3.3 (L) 08/21/2022 0043   AST 39 08/21/2022 0043   ALT 21 08/21/2022 0043   ALKPHOS 52 08/21/2022 0043   BILITOT 0.6 08/21/2022 0043   GFRNONAA 53 (L) 08/21/2022 0043   GFRAA >60 09/15/2015 1206   Lipase     Component Value Date/Time   LIPASE 40 08/20/2022 0108       Studies/Results: DG Cholangiogram Operative  Result Date: 08/20/2022 CLINICAL DATA:  55 female undergoing laparoscopic cholecystectomy EXAM: INTRAOPERATIVE CHOLANGIOGRAM TECHNIQUE: Cholangiographic images from the C-arm fluoroscopic device were submitted for interpretation post-operatively. Please see the procedural report for the amount of contrast and the fluoroscopy time utilized. COMPARISON:  CT abdomen pelvis from 08/20/2022 FINDINGS: Intraoperative antegrade injection via the cystic duct which opacifies the common bile duct and central portions of the intrahepatic biliary tree. Contrast is visualized flowing freely into the duodenum. There are no filling defects. No significant intra or extrahepatic biliary ductal dilation. No apparent anomalous anatomical configuration of the biliary tree. IMPRESSION: No evidence of choledocholithiasis. Ruthann Cancer, MD [facility] Radiology Electronically Signed   By: Ruthann Cancer M.D.   On: 08/20/2022 10:18   US Abdomen Limited RUQ (LIVER/GB)  Result Date: 08/20/2022 CLINICAL DATA:  Right upper quadrant pain. EXAM: ULTRASOUND ABDOMEN LIMITED RIGHT UPPER QUADRANT COMPARISON:  CT  08/20/2022 FINDINGS: Gallbladder: Cholelithiasis with 2.9 cm diameter stone in the gallbladder neck. Sludge in the gallbladder. Focal polypoid lesion at the apex of the gallbladder measuring 1.4 x 2.6 cm diameter and containing central flow on color flow Doppler imaging. This could represent neoplasm and based on size criteria, hepatic MRI is suggested for further evaluation. No gallbladder wall thickening or edema is otherwise identified. Murphy's  sign is negative. Common bile duct: Diameter: 6 mm, normal Liver: Increased parenchymal echotexture of the liver suggesting fatty infiltration. No focal mass identified. Portal vein is patent on color Doppler imaging with normal direction of blood flow towards the liver. Other: Incidental note of a 6 mm hyperechoic focus in the upper pole of the right kidney likely representing an angiomyolipoma. No shadowing is identified to suggest a stone. IMPRESSION: 1. Cholelithiasis without additional changes to suggest acute cholecystitis. 2. 1.4 x 2.6 cm polypoid lesion in the apex of the gallbladder with flow demonstrated. MRI is suggested for further characterization in the elective setting. 3. Diffuse fatty infiltration of the liver. 4. Hyperechoic lesion in the upper pole of the right kidney likely represents angiomyolipoma. Nonshadowing stone would be a secondary possibility. Electronically Signed   By: Lucienne Capers M.D.   On: 08/20/2022 04:00   CT Angio Chest/Abd/Pel for Dissection W and/or Wo Contrast  Result Date: 08/20/2022 CLINICAL DATA:  Chest pain or back pain, aortic dissection suspected. Dyspnea, nausea, vomiting, diarrhea. EXAM: CT ANGIOGRAPHY CHEST, ABDOMEN AND PELVIS TECHNIQUE: Non-contrast CT of the chest was initially obtained. Multidetector CT imaging through the chest, abdomen and pelvis was performed using the standard protocol during bolus administration of intravenous contrast. Multiplanar reconstructed images and MIPs were obtained and reviewed to evaluate the vascular anatomy. RADIATION DOSE REDUCTION: This exam was performed according to the departmental dose-optimization program which includes automated exposure control, adjustment of the mA and/or kV according to patient size and/or use of iterative reconstruction technique. CONTRAST:  50m OMNIPAQUE IOHEXOL 350 MG/ML SOLN COMPARISON:  None Available. FINDINGS: CTA CHEST FINDINGS Cardiovascular: Preferential opacification of the thoracic  aorta. No evidence of thoracic aortic aneurysm or dissection. Normal heart size. No pericardial effusion. Mediastinum/Nodes: Visualized thyroid is unremarkable. No pathologic thoracic adenopathy. Esophagus unremarkable. Lungs/Pleura: Lobulated pulmonary nodule is seen within the left lower lobe measuring 2.1 x 2.2 x 2.3 cm at axial image # 95/8 and coronal image # 115/9. The mass occludes a subsegmental bronchus of the posterior basal segment of the left lower lobe. 6 mm mean diameter noncalcified pulmonary nodule within the right middle lobe, axial image # 92, series 8, indeterminate. Numerous 3-5 mm noncalcified pulmonary nodules are identified within the left lower lobe at axial image # 72, left upper lobe at axial image # 72, # 71, and # 54, and within the right upper lobe at axial image # 68, series 8. These are indeterminate though their multiplicity favors the sequela of a remote infectious or inflammatory process. Mosaic attenuation of the pulmonary parenchyma is in keeping with multifocal air trapping related to probable small airways disease. No pneumothorax or pleural effusion. Central airways are widely patent. Musculoskeletal: No acute bone abnormality. Healed fracture deformity of the mid diaphysis of the left clavicle. No lytic or blastic bone lesion. Review of the MIP images confirms the above findings. CTA ABDOMEN AND PELVIS FINDINGS VASCULAR Aorta: Normal caliber aorta without aneurysm, dissection, vasculitis or significant stenosis. Celiac: Patent without evidence of aneurysm, dissection, vasculitis or significant stenosis. SMA: Patent without evidence of aneurysm, dissection, vasculitis or  significant stenosis. Renals: Dual renal arteries bilaterally. Wide patency. Normal vascular morphology. No aneurysm or dissection. IMA: Patent without evidence of aneurysm, dissection, vasculitis or significant stenosis. Inflow: Patent without evidence of aneurysm, dissection, vasculitis or significant  stenosis. Veins: No obvious venous abnormality within the limitations of this arterial phase study. Review of the MIP images confirms the above findings. NON-VASCULAR Hepatobiliary: 3.1 cm lamellated gallstone is seen impacted within the a gallbladder neck. The gallbladder is distended. No superimposed pericholecystic inflammatory changes, however, are identified to suggest changes of acute cholecystitis. Liver unremarkable. No intra or extrahepatic biliary ductal dilation. Pancreas: Unremarkable Spleen: Unremarkable Adrenals/Urinary Tract: Adrenal glands are unremarkable. Kidneys are normal, without renal calculi, focal lesion, or hydronephrosis. Bladder is unremarkable. Stomach/Bowel: There is eccentric mural thickening involving the ascending colon just distal to the ileocecal junction and an eccentric mural mass is not excluded. There is no evidence of obstruction or pericolonic infiltration. The stomach, small bowel, and large bowel are otherwise unremarkable. The appendix is normal. No free intraperitoneal gas or fluid. Lymphatic: No pathologic adenopathy within the abdomen and pelvis. Reproductive: Lobulated appearance of the uterus may relate to underlying uterine fibroids. The pelvic organs are otherwise unremarkable. Other: No abdominal wall hernia. Musculoskeletal: No acute bone abnormality. No lytic or blastic bone lesion. Review of the MIP images confirms the above findings. IMPRESSION: 1. No evidence of thoracic or abdominal aortic aneurysm or dissection. 2. 2.3 cm lobulated pulmonary nodule within the left lower lobe, suspicious for a primary pulmonary malignancy. Consider one of the following for both low-risk and high-risk individuals: (a) repeat chest CT in 3 months, (b) PET-CT, or (c) tissue sampling. This recommendation follows the consensus statement: Guidelines for Management of Incidental Pulmonary Nodules Detected on CT Images: From the Fleischner Society 2017; Radiology 2017; 284:228-243. 3.  Multiple additional 3-5 mm noncalcified pulmonary nodules within the lungs bilaterally. These are indeterminate though their multiplicity favors the sequela of a remote infectious or inflammatory process. No follow-up needed if patient is low-risk (and has no known or suspected primary neoplasm). Non-contrast chest CT can be considered in 12 months if patient is high-risk. This recommendation follows the consensus statement: Guidelines for Management of Incidental Pulmonary Nodules Detected on CT Images: From the Fleischner Society 2017; Radiology 2017; 284:228-243. 4. Eccentric mural thickening involving the ascending colon just distal to the ileocecal junction. While this may be related to underdistention, an eccentric mural mass is not excluded. Correlation with endoscopy is recommended. 5. Cholelithiasis with a 3.1 cm lamellated gallstone impacted within the gallbladder neck. The gallbladder is distended though no superimposed pericholecystic inflammatory changes are identified to suggest acute cholecystitis. If there is clinical concern for acute cholecystitis, right upper quadrant sonography may be helpful for further evaluation. 6. Mosaic attenuation of the pulmonary parenchyma in keeping with multifocal air trapping related to small airways disease. Electronically Signed   By: Fidela Salisbury M.D.   On: 08/20/2022 02:56   DG Chest 1 View  Result Date: 08/20/2022 CLINICAL DATA:  Chest pain and shortness of breath EXAM: CHEST  1 VIEW COMPARISON:  07/02/2021 FINDINGS: Heart size and pulmonary vascularity are normal. Bronchial wall thickening and bronchiectasis demonstrated in the left lower lung similar to prior study. No developing consolidation or edema. No pleural effusions. No pneumothorax. Mediastinal contours appear intact. IMPRESSION: Bronchiectasis and bronchial wall thickening in the left lower lung. No developing consolidation. Electronically Signed   By: Lucienne Capers M.D.   On: 08/20/2022  01:28    Anti-infectives: Anti-infectives (  From admission, onward)    Start     Dose/Rate Route Frequency Ordered Stop   08/21/22 0800  cefTRIAXone (ROCEPHIN) 2 g in sodium chloride 0.9 % 100 mL IVPB        2 g 200 mL/hr over 30 Minutes Intravenous Every 24 hours 08/20/22 0716     08/20/22 0345  cefTRIAXone (ROCEPHIN) 2 g in sodium chloride 0.9 % 100 mL IVPB        2 g 200 mL/hr over 30 Minutes Intravenous  Once 08/20/22 0336 08/20/22 0540        Assessment/Plan  Acute cholecystitis POD#1 s/p laparoscopic cholecystectomy with IOC 08/20/22 Dr. Georgette Dover  VSS. WBC remains elevated, but no fevers. Her creatinine remains somewhat elevated, at 1.19 (values drawn yesterday include 1.05 and 1.0), but she has no other concerning symptoms. This is likely related to some acute dehydration, and will hopefully improve as nutrition and hydration improve.   FEN: continue with full diet as tolerated ID: no further abx indicated from surgical perspective  VTE: SCDs, Lovenox injection Dispo: Stable for discharge from surgical perspective, recommend outpatient BMP.  LOS: 0 days   I reviewed last 24 h vitals and pain scores, last 48 h intake and output, last 24 h labs and trends, and last 24 h imaging results.    , PA-S  [facility] Please see Amion for pager number during day hours 7:00am-4:30pm

## 2022-08-21 NOTE — Progress Notes (Signed)
PROGRESS NOTE    Kendra Castro  JME:[id number] DOB: [birth year] DOA: 08/20/2022 PCP: Pcp, No     Brief Narrative:   hypertension, prediabetes, uterine fibroids, and obesity who presents with complaints of chest pain.  Patient has been in her normal state of health up until 2 days ago when she ate a granola bar.  Thereafter patient developed lower substernal pain that she felt epigastrically and in her right upper quadrant of her abdomen.  Pain was described as burning and pressure, but was intermittently stabbing in nature.  Symptoms seemed to radiate to her back, shoulder blades, and on the left side of her neck.  She tried drinking Pepto-Bismol to treat symptoms.  Noted associated subjective fever, clamminess, shortness of breath, nausea, vomiting, and soft/loose stools. Emesis was nonbloody and nonbilious in appearance.   CT angiogram of the chest/abdomen/pelvis was obtained which noted 2.3 cm lobulated pulmonary nodule left lower lobe, multiple noncalcified pulmonary nodules within the bilateral lungs, mural thickening of the ascending colon just distal to the ileocecal junction, cholelithiasis with a 3.1 cm laminated gallstone impacted within the gallbladder neck with distention of the gallbladder without clear signs of cholecystitis, and no signs of a dissection or aneurysm.  Ultrasound characterizes it as a 1.4 x 2.6 polypoid lesion in the apex of the gallbladder with flow demonstrated without signs of cholecystitis.  General surgery  consulted.  s/p laparoscopic cholecystectomy with IOC 08/20/22 Dr. Georgette Dover   Subjective:  POD #1, reports right flank pain, think from constipation, agreed to miralax  Wbc and cr elevated,  She ate 100% breakfast  Assessment & Plan:  Principal Problem:   Impacted gallstone of gallbladder Active Problems:   Leukocytosis   Sinus bradycardia   Essential hypertension   Pulmonary nodules   Mural thickening of colon   Prediabetes   Obesity (BMI  30-39.9)   Impacted gallstone -Status post lap chole -Appreciate GEN surge input  Leukocytosis Persistently elevated, UA and urine culture pending Treat constipation Repeat CBC in the morning Has been on Rocephin, continue for now  Mild elevation of creatinine Check UA urine culture Encourage oral intake Repeat BMP in the morning  Mural thickening of colon Outpatient colonoscopy recommended  Lung nodule in non-smoker Seen by pulmonology, recommend outpatient follow-up     Body mass index is 42.91 kg/m..,  Meet class III obesity criteria .   I have Reviewed nursing notes, Vitals, pain scores, I/o's, Lab results and  imaging results since pt's last encounter, details please see discussion above  I ordered the following labs:  Unresulted Labs (From admission, onward)     Start     Ordered   08/22/22 0500  CBC with Differential/Platelet  Tomorrow morning,   R       Question:  Specimen collection method  Answer:  Lab=Lab collect   08/21/22 0942   08/22/22 2297  Basic metabolic panel  Tomorrow morning,   R       Question:  Specimen collection method  Answer:  Lab=Lab collect   08/21/22 0942   08/21/22 1003  Urinalysis, Routine w reflex microscopic Urine, Clean Catch  Once,   R        08/21/22 1002   08/21/22 1003  Urine Culture  (Urine Culture)  Once,   R       Question:  Indication  Answer:  Flank Pain   08/21/22 1003             DVT prophylaxis:    Code Status:  Code Status: Full Code  Family Communication: Patient Disposition:    Dispo: The patient is from: Home              Anticipated d/c is to: Home              Anticipated d/c date is: When pain improved, monitor WBC and creatinine, follow-up on urine culture  Antimicrobials:    Anti-infectives (From admission, onward)    Start     Dose/Rate Route Frequency Ordered Stop   08/21/22 0800  cefTRIAXone (ROCEPHIN) 2 g in sodium chloride 0.9 % 100 mL IVPB        2 g 200 mL/hr over 30 Minutes  Intravenous Every 24 hours 08/20/22 0716     08/20/22 0345  cefTRIAXone (ROCEPHIN) 2 g in sodium chloride 0.9 % 100 mL IVPB        2 g 200 mL/hr over 30 Minutes Intravenous  Once 08/20/22 0336 08/20/22 0540          Objective: Vitals:   08/20/22 2024 08/21/22 0026 08/21/22 0602 08/21/22 0923  BP: (!) 136/52 (!) 134/55 (!) 150/46 (!) 124/59  Pulse: 67 63 (!) 50 (!) 49  Resp: '20 20 16 18  '$ Temp: 98.9 F (37.2 C) 98.8 F (37.1 C) 99.2 F (37.3 C) 97.8 F (36.6 C)  TempSrc: Oral Oral Oral Oral  SpO2: 94% 94% 98% 96%  Weight:      Height:        Intake/Output Summary (Last 24 hours) at 08/21/2022 1149 Last data filed at 08/21/2022 0400 Gross per 24 hour  Intake 1367.53 ml  Output --  Net 1367.53 ml   Filed Weights   08/20/22 0905  Weight: 113.4 kg    Examination:  General exam: alert, awake, communicative,calm, NAD Respiratory system: Clear to auscultation. Respiratory effort normal. Cardiovascular system:  RRR.  Gastrointestinal system: Abdomen post op changes,   Normal bowel sounds heard. Central nervous system: Alert and oriented. No focal neurological deficits. Extremities:  no edema Skin: No rashes, lesions or ulcers Psychiatry: Judgement and insight appear normal. Mood & affect appropriate.     Data Reviewed: I have personally reviewed  labs and visualized  imaging studies since the last encounter and formulate the plan        Scheduled Meds:  enoxaparin (LOVENOX) injection  50 mg Subcutaneous Q24H   polyethylene glycol  17 g Oral Daily   sodium chloride flush  3 mL Intravenous Q12H   Continuous Infusions:  sodium chloride 75 mL/hr at 08/21/22 0500   cefTRIAXone (ROCEPHIN)  IV 2 g (08/21/22 0758)     LOS: 0 days     Florencia Reasons, MD PhD FACP [facility]  Available via Epic secure chat 7am-7pm for nonurgent issues Please page for urgent issues To page the attending provider between 7A-7P or the covering provider during after hours 7P-7A,  please log into the web site [email or web address] and access using universal [place] password for that web site. If you do not have the password, please call the hospital operator.    08/21/2022, 11:49 AM

## 2022-08-22 ENCOUNTER — Encounter (HOSPITAL_COMMUNITY): Payer: Self-pay | Admitting: Internal Medicine

## 2022-08-22 DIAGNOSIS — K8020 Calculus of gallbladder without cholecystitis without obstruction: Secondary | ICD-10-CM | POA: Diagnosis not present

## 2022-08-22 LAB — CBC WITH DIFFERENTIAL/PLATELET
Abs Immature Granulocytes: 0.2 10*3/uL — ABNORMAL HIGH (ref 0.00–0.07)
Basophils Absolute: 0.1 10*3/uL (ref 0.0–0.1)
Basophils Relative: 0 %
Eosinophils Absolute: 0.2 10*3/uL (ref 0.0–0.5)
Eosinophils Relative: 1 %
HCT: 36.6 % (ref 36.0–46.0)
Hemoglobin: 12.2 g/dL (ref 12.0–15.0)
Immature Granulocytes: 1 %
Lymphocytes Relative: 15 %
Lymphs Abs: 2.2 10*3/uL (ref 0.7–4.0)
MCH: 28.8 pg (ref 26.0–34.0)
MCHC: 33.3 g/dL (ref 30.0–36.0)
MCV: 86.3 fL (ref 80.0–100.0)
Monocytes Absolute: 0.9 10*3/uL (ref 0.1–1.0)
Monocytes Relative: 6 %
Neutro Abs: 11.5 10*3/uL — ABNORMAL HIGH (ref 1.7–7.7)
Neutrophils Relative %: 77 %
Platelets: 233 10*3/uL (ref 150–400)
RBC: 4.24 MIL/uL (ref 3.87–5.11)
RDW: 14 % (ref 11.5–15.5)
WBC: 15.1 10*3/uL — ABNORMAL HIGH (ref 4.0–10.5)
nRBC: 0 % (ref 0.0–0.2)

## 2022-08-22 LAB — EXPECTORATED SPUTUM ASSESSMENT W GRAM STAIN, RFLX TO RESP C

## 2022-08-22 LAB — BASIC METABOLIC PANEL
Anion gap: 7 (ref 5–15)
BUN: 15 mg/dL (ref 6–20)
CO2: 24 mmol/L (ref 22–32)
Calcium: 8.1 mg/dL — ABNORMAL LOW (ref 8.9–10.3)
Chloride: 105 mmol/L (ref 98–111)
Creatinine, Ser: 1.02 mg/dL — ABNORMAL HIGH (ref 0.44–1.00)
GFR, Estimated: >60 mL/min (ref >60)
Glucose, Bld: 142 mg/dL — ABNORMAL HIGH (ref 70–99)
Potassium: 3.9 mmol/L (ref 3.5–5.1)
Sodium: 136 mmol/L (ref 135–145)

## 2022-08-22 LAB — URINE CULTURE: Culture: <10000 — AB

## 2022-08-22 MED ORDER — BISACODYL 10 MG RE SUPP
10.0000 mg | Freq: Every day | RECTAL | Status: DC
  Administered 2022-08-22: 10 mg via RECTAL
  Filled 2022-08-22 (×2): qty 1

## 2022-08-22 MED ORDER — GUAIFENESIN ER 600 MG PO TB12
600.0000 mg | ORAL_TABLET | Freq: Two times a day (BID) | ORAL | Status: DC
  Administered 2022-08-22 – 2022-08-23 (×3): 600 mg via ORAL
  Filled 2022-08-22 (×3): qty 1

## 2022-08-22 MED ORDER — SENNOSIDES-DOCUSATE SODIUM 8.6-50 MG PO TABS
1.0000 | ORAL_TABLET | Freq: Two times a day (BID) | ORAL | Status: DC
  Administered 2022-08-22 – 2022-08-23 (×3): 1 via ORAL
  Filled 2022-08-22 (×3): qty 1

## 2022-08-22 NOTE — Progress Notes (Signed)
Mobility Specialist - Progress Note   08/22/22 1032  Mobility  Activity Ambulated with assistance in hallway  Level of Assistance Contact guard assist, steadying assist  Assistive Device None  Distance Ambulated (ft) 60 ft  Activity Response Tolerated fair  $Mobility charge 1 Mobility    Pt received in bed agreeable to mobility. C/o stomach pain 5/10. Took standing break x2. Left in BR w/ all needs met.   Paulla Dolly Mobility Specialist

## 2022-08-22 NOTE — Progress Notes (Signed)
Mobility Specialist - Progress Note   08/22/22 1632  Mobility  Activity Ambulated with assistance in hallway  Level of Assistance Standby assist, set-up cues, supervision of patient - no hands on  Assistive Device None  Distance Ambulated (ft) 200 ft  Activity Response Tolerated well  $Mobility charge 1 Mobility    Pt received in recliner requesting to ambulate in hall. No complaints throughout. Left in recliner w/ call bell in reach and all needs met.   Paulla Dolly Mobility Specialist

## 2022-08-22 NOTE — Progress Notes (Signed)
[facility] Progress Note  2 Days Post-Op  Subjective: CC:  Resting comfortably. Pain a little better today. Kidney function improved. States she tolerated PO and mobilized yesterday. +flatus, No BM yet.  Objective: Vital signs in last 24 hours: Temp:  [97.8 F (36.6 C)-99.8 F (37.7 C)] 98.3 F (36.8 C) (10/11 0802) Pulse Rate:  [49-68] 63 (10/11 0802) Resp:  [16-18] 16 (10/11 0802) BP: (115-136)/(57-77) 136/77 (10/11 0802) SpO2:  [92 %-96 %] 94 % (10/11 0802) Last BM Date : 08/19/22  Intake/Output from previous day: 10/10 0701 - 10/11 0700 In: 2658.5 [P.O.:600; I.V.:1958.5; IV Piggyback:100] Out: -  Intake/Output this shift: No intake/output data recorded.  PE: Gen:  Alert, NAD, pleasant Card:  Regular rate and rhythm, pedal pulses 2+ BL Pulm:  Normal effort, clear to auscultation bilaterally Abd: Soft, appropriately tender, non-distended, bowel sounds present in all 4 quadrants, no HSM, incisions C/D/I Skin: warm and dry, no rashes  Psych: A&Ox3   Lab Results:  Recent Labs    08/21/22 0043 08/22/22 0201  WBC 17.7* 15.1*  HGB 12.4 12.2  HCT 37.4 36.6  PLT 252 233   BMET Recent Labs    08/21/22 0043 08/22/22 0201  NA 136 136  K 3.9 3.9  CL 102 105  CO2 25 24  GLUCOSE 147* 142*  BUN 14 15  CREATININE 1.19* 1.02*  CALCIUM 8.6* 8.1*   PT/INR No results for input(s): "LABPROT", "INR" in the last 72 hours. CMP     Component Value Date/Time   NA 136 08/22/2022 0201   K 3.9 08/22/2022 0201   CL 105 08/22/2022 0201   CO2 24 08/22/2022 0201   GLUCOSE 142 (H) 08/22/2022 0201   BUN 15 08/22/2022 0201   CREATININE 1.02 (H) 08/22/2022 0201   CREATININE 1.05 03/27/2017 1049   CALCIUM 8.1 (L) 08/22/2022 0201   PROT 6.9 08/21/2022 0043   ALBUMIN 3.3 (L) 08/21/2022 0043   AST 39 08/21/2022 0043   ALT 21 08/21/2022 0043   ALKPHOS 52 08/21/2022 0043   BILITOT 0.6 08/21/2022 0043   GFRNONAA >60 08/22/2022 0201   GFRAA >60 09/15/2015 1206    Lipase     Component Value Date/Time   LIPASE 40 08/20/2022 0108       Studies/Results: DG Cholangiogram Operative  Result Date: 08/20/2022 CLINICAL DATA:  25 female undergoing laparoscopic cholecystectomy EXAM: INTRAOPERATIVE CHOLANGIOGRAM TECHNIQUE: Cholangiographic images from the C-arm fluoroscopic device were submitted for interpretation post-operatively. Please see the procedural report for the amount of contrast and the fluoroscopy time utilized. COMPARISON:  CT abdomen pelvis from 08/20/2022 FINDINGS: Intraoperative antegrade injection via the cystic duct which opacifies the common bile duct and central portions of the intrahepatic biliary tree. Contrast is visualized flowing freely into the duodenum. There are no filling defects. No significant intra or extrahepatic biliary ductal dilation. No apparent anomalous anatomical configuration of the biliary tree. IMPRESSION: No evidence of choledocholithiasis. Ruthann Cancer, MD [facility] Electronically Signed   By: Ruthann Cancer M.D.   On: 08/20/2022 10:18    Anti-infectives: Anti-infectives (From admission, onward)    Start     Dose/Rate Route Frequency Ordered Stop   08/21/22 0800  cefTRIAXone (ROCEPHIN) 2 g in sodium chloride 0.9 % 100 mL IVPB        2 g 200 mL/hr over 30 Minutes Intravenous Every 24 hours 08/20/22 0716     08/20/22 0345  cefTRIAXone (ROCEPHIN) 2 g in sodium chloride 0.9 % 100  mL IVPB        2 g 200 mL/hr over 30 Minutes Intravenous  Once 08/20/22 0336 08/20/22 0540        Assessment/Plan  Acute cholecystitis POD#2 s/p laparoscopic cholecystectomy with IOC 08/20/22 Dr. Georgette Dover  VSS. WBC and creatinine downtrending. HH diet No further abx Stable for discharge.    FEN: continue with full diet as tolerated ID: no further abx indicated from surgical perspective  VTE: SCDs, Lovenox injection Dispo: Stable for discharge from surgical  perspective   LOS: 0 days   I reviewed last 24 h vitals and pain scores, last 48 h intake and output, last 24 h labs and trends, and last 24 h imaging results.   [facility] Please see Amion for pager number during day hours 7:00am-4:30pm

## 2022-08-22 NOTE — Progress Notes (Addendum)
PROGRESS NOTE    Kendra Castro  HGD:[id number] DOB: [birth year] DOA: 08/20/2022 PCP: Nolene Ebbs, MD     Brief Narrative:   hypertension, prediabetes, uterine fibroids, and obesity who presents with complaints of chest pain.  Patient has been in her normal state of health up until 2 days ago when she ate a granola bar.  Thereafter patient developed lower substernal pain that she felt epigastrically and in her right upper quadrant of her abdomen.  Pain was described as burning and pressure, but was intermittently stabbing in nature.  Symptoms seemed to radiate to her back, shoulder blades, and on the left side of her neck.  She tried drinking Pepto-Bismol to treat symptoms.  Noted associated subjective fever, clamminess, shortness of breath, nausea, vomiting, and soft/loose stools. Emesis was nonbloody and nonbilious in appearance.   CT angiogram of the chest/abdomen/pelvis was obtained which noted 2.3 cm lobulated pulmonary nodule left lower lobe, multiple noncalcified pulmonary nodules within the bilateral lungs, mural thickening of the ascending colon just distal to the ileocecal junction, cholelithiasis with a 3.1 cm laminated gallstone impacted within the gallbladder neck with distention of the gallbladder without clear signs of cholecystitis, and no signs of a dissection or aneurysm.  Ultrasound characterizes it as a 1.4 x 2.6 polypoid lesion in the apex of the gallbladder with flow demonstrated without signs of cholecystitis.  General surgery  consulted.  s/p laparoscopic cholecystectomy with IOC 08/20/22 Dr. Georgette Dover   Subjective:  POD #2 reports low  ab  pain, feeling tire, reports productive cough, does not feel she can go home today  Wbc and cr slightly improved but remain elevated,    Assessment & Plan:  Principal Problem:   Impacted gallstone of gallbladder Active Problems:   Leukocytosis   Sinus bradycardia   Essential hypertension   Pulmonary nodules   Mural thickening of  colon   Prediabetes   Obesity (BMI 30-39.9)   Impacted gallstone -Status post lap chole -Appreciate GEN surge input  Leukocytosis Persistently elevated,  urine culture no significant growth Will add blood culture , possible bronchitis as well seen on initial chest imaging , seen by pulmonology  Treat constipation Repeat CBC in the morning Has been on Rocephin, continue for now  Mild elevation of creatinine UA trace leuk , rare bacteria ,urine culture negative for growth Encourage oral intake Repeat BMP in the morning  Mural thickening of colon Outpatient colonoscopy recommended  Lung nodule in non-smoker Seen by pulmonology, recommend outpatient follow-up     Body mass index is 42.91 kg/m..,  Meet class III obesity criteria .   I have Reviewed nursing notes, Vitals, pain scores, I/o's, Lab results and  imaging results since pt's last encounter, details please see discussion above  I ordered the following labs:  Unresulted Labs (From admission, onward)     Start     Ordered   08/23/22 0500  CBC with Differential/Platelet  Tomorrow morning,   R       Question:  Specimen collection method  Answer:  Lab=Lab collect   08/22/22 0942   08/23/22 9622  Basic metabolic panel  Tomorrow morning,   R       Question:  Specimen collection method  Answer:  Lab=Lab collect   08/22/22 0942   08/22/22 0942  Culture, blood (Routine X 2) w Reflex to ID Panel  BLOOD CULTURE X 2,   R (with TIMED occurrences)      08/22/22 0941   08/22/22 0942  Expectorated Sputum Assessment  w Gram Stain, Rflx to Resp Cult  Once,   R        08/22/22 0941             DVT prophylaxis:    Code Status:   Code Status: Full Code  Family Communication: Patient Disposition:    Dispo: The patient is from: Home              Anticipated d/c is to: Home              Anticipated d/c date is: When  cough, pain improved, monitor WBC and creatinine, follow-up on blood culture/sputum  culture  Antimicrobials:    Anti-infectives (From admission, onward)    Start     Dose/Rate Route Frequency Ordered Stop   08/21/22 0800  cefTRIAXone (ROCEPHIN) 2 g in sodium chloride 0.9 % 100 mL IVPB        2 g 200 mL/hr over 30 Minutes Intravenous Every 24 hours 08/20/22 0716     08/20/22 0345  cefTRIAXone (ROCEPHIN) 2 g in sodium chloride 0.9 % 100 mL IVPB        2 g 200 mL/hr over 30 Minutes Intravenous  Once 08/20/22 0336 08/20/22 0540          Objective: Vitals:   08/21/22 1637 08/21/22 2109 08/22/22 0601 08/22/22 0802  BP: (!) 134/59 (!) 115/57 133/61 136/77  Pulse: 63 60 68 63  Resp: 17   16  Temp: 99.8 F (37.7 C) 99.6 F (37.6 C) 98.4 F (36.9 C) 98.3 F (36.8 C)  TempSrc: Oral Oral Oral Oral  SpO2: 96% 92% 96% 94%  Weight:      Height:        Intake/Output Summary (Last 24 hours) at 08/22/2022 0943 Last data filed at 08/22/2022 2482 Gross per 24 hour  Intake 2178.48 ml  Output --  Net 2178.48 ml   Filed Weights   08/20/22 0905  Weight: 113.4 kg    Examination:  General exam: alert, awake, communicative,calm, NAD Respiratory system: Scattered rhonchi left lower lobe, no wheezing, respiratory effort normal. Cardiovascular system:  RRR.  Gastrointestinal system: Abdomen post op changes,   Normal bowel sounds heard. Central nervous system: Alert and oriented. No focal neurological deficits. Extremities:  no edema Skin: No rashes, lesions or ulcers Psychiatry: Judgement and insight appear normal. Mood & affect appropriate.     Data Reviewed: I have personally reviewed  labs and visualized  imaging studies since the last encounter and formulate the plan        Scheduled Meds:  bisacodyl  10 mg Rectal Daily   enoxaparin (LOVENOX) injection  50 mg Subcutaneous Q24H   guaiFENesin  600 mg Oral BID   polyethylene glycol  17 g Oral Daily   senna-docusate  1 tablet Oral BID   sodium chloride flush  3 mL Intravenous Q12H   Continuous  Infusions:  cefTRIAXone (ROCEPHIN)  IV 2 g (08/22/22 0831)     LOS: 0 days     Florencia Reasons, MD PhD FACP [facility]  Available via Epic secure chat 7am-7pm for nonurgent issues Please page for urgent issues To page the attending provider between 7A-7P or the covering provider during after hours 7P-7A, please log into the web site [email or web address] and access using universal [place] password for that web site. If you do not have the password, please call the hospital operator.    08/22/2022, 9:43 AM

## 2022-08-23 DIAGNOSIS — K8020 Calculus of gallbladder without cholecystitis without obstruction: Secondary | ICD-10-CM | POA: Diagnosis not present

## 2022-08-23 LAB — CBC WITH DIFFERENTIAL/PLATELET
Abs Immature Granulocytes: 0.17 10*3/uL — ABNORMAL HIGH (ref 0.00–0.07)
Basophils Absolute: 0.1 10*3/uL (ref 0.0–0.1)
Basophils Relative: 1 %
Eosinophils Absolute: 0.3 10*3/uL (ref 0.0–0.5)
Eosinophils Relative: 2 %
HCT: 35.8 % — ABNORMAL LOW (ref 36.0–46.0)
Hemoglobin: 11.6 g/dL — ABNORMAL LOW (ref 12.0–15.0)
Immature Granulocytes: 1 %
Lymphocytes Relative: 21 %
Lymphs Abs: 2.5 10*3/uL (ref 0.7–4.0)
MCH: 28 pg (ref 26.0–34.0)
MCHC: 32.4 g/dL (ref 30.0–36.0)
MCV: 86.5 fL (ref 80.0–100.0)
Monocytes Absolute: 0.7 10*3/uL (ref 0.1–1.0)
Monocytes Relative: 6 %
Neutro Abs: 8.4 10*3/uL — ABNORMAL HIGH (ref 1.7–7.7)
Neutrophils Relative %: 69 %
Platelets: 219 10*3/uL (ref 150–400)
RBC: 4.14 MIL/uL (ref 3.87–5.11)
RDW: 13.5 % (ref 11.5–15.5)
WBC: 12.2 10*3/uL — ABNORMAL HIGH (ref 4.0–10.5)
nRBC: 0 % (ref 0.0–0.2)

## 2022-08-23 LAB — BASIC METABOLIC PANEL
Anion gap: 11 (ref 5–15)
BUN: 12 mg/dL (ref 6–20)
CO2: 24 mmol/L (ref 22–32)
Calcium: 8.5 mg/dL — ABNORMAL LOW (ref 8.9–10.3)
Chloride: 104 mmol/L (ref 98–111)
Creatinine, Ser: 1.07 mg/dL — ABNORMAL HIGH (ref 0.44–1.00)
GFR, Estimated: >60 mL/min (ref >60)
Glucose, Bld: 117 mg/dL — ABNORMAL HIGH (ref 70–99)
Potassium: 3.8 mmol/L (ref 3.5–5.1)
Sodium: 139 mmol/L (ref 135–145)

## 2022-08-23 MED ORDER — SENNOSIDES-DOCUSATE SODIUM 8.6-50 MG PO TABS: 1.0000 | ORAL_TABLET | Freq: Two times a day (BID) | ORAL | Status: AC

## 2022-08-23 MED ORDER — GUAIFENESIN ER 600 MG PO TB12: 600.0000 mg | ORAL_TABLET | Freq: Two times a day (BID) | ORAL | Status: AC

## 2022-08-23 MED ORDER — POLYETHYLENE GLYCOL 3350 17 G PO PACK: 17.0000 g | PACK | Freq: Every day | ORAL | 0 refills | Status: AC

## 2022-08-23 MED ORDER — AMOXICILLIN-POT CLAVULANATE 875-125 MG PO TABS: 1.0000 | ORAL_TABLET | Freq: Two times a day (BID) | ORAL | 0 refills | Status: AC

## 2022-08-23 MED ORDER — AMLODIPINE BESYLATE 10 MG PO TABS: 10.0000 mg | ORAL_TABLET | Freq: Every day | ORAL | 0 refills | Status: AC

## 2022-08-23 NOTE — Progress Notes (Signed)
Feeling better today. Tolerating PO. We discussed bathing and lifting restrictions. Discussed need for outpatient colonoscopy. Follow up provided  Obie Dredge, [facility] Please see Amion for pager number during day hours 7:00am-4:30pm

## 2022-08-23 NOTE — Discharge Summary (Signed)
Discharge Summary  Kendra Castro EUM:[id number] DOB: 02-27-1966  PCP: Nolene Ebbs, MD  Admit date: 08/20/2022 Discharge date: 08/23/2022    Time spent: 20mns, more than 50% time spent on coordination of care.   Recommendations for Outpatient Follow-up:  F/u with PCP within a week  for hospital discharge follow up, repeat cbc/bmp at follow up, pcp to repeat cxr if cough persists F/u with gen surg F/u with GI for colonoscopy F/u with pulm for abnormal lung findings on CT     Discharge Diagnoses:  Active Hospital Problems   Diagnosis Date Noted   Impacted gallstone of gallbladder 08/20/2022    Priority: 1.   Leukocytosis 08/20/2022    Priority: 2.   Sinus bradycardia 08/20/2022    Priority: 3.   Essential hypertension 09/15/2015    Priority: 4.   Pulmonary nodules 08/20/2022    Priority: 5.   Mural thickening of colon 08/20/2022    Priority: 6.   Prediabetes 12/04/2017    Priority: 7.   Obesity (BMI 30-39.9) 08/20/2022    Priority: 8.    Resolved Hospital Problems  No resolved problems to display.    Discharge Condition: stable  Diet recommendation: heart healthy  Filed Weights   08/20/22 0905  Weight: 113.4 kg    History of present illness: ( per admitting MD Dr STamala Julian TCloria Springis a 56y.o. female with medical history significant of hypertension, prediabetes, uterine fibroids, and obesity who presents with complaints of chest pain.  Patient has been in her normal state of health up until 2 days ago when she ate a granola bar.  Thereafter patient developed lower substernal pain that she felt epigastrically and in her right upper quadrant of her abdomen.  Pain was described as burning and pressure, but was intermittently stabbing in nature.  Symptoms seemed to radiate to her back, shoulder blades, and on the left side of her neck.  She tried drinking Pepto-Bismol to treat symptoms.  Noted associated subjective fever, clamminess, shortness of breath, nausea,  vomiting, and soft/loose stools. Emesis was nonbloody and nonbilious in appearance.  Only reports having 1 bowel movement per day.  Denies seeing any blood in her stools, cough, weight loss, or dysuria.  She has never had a colonoscopy although advised by her primary before. Patient is never smoked cigarettes and does not drink alcohol.  Denies any family history of colon cancer, but her mom did die in her 720sfrom pancreatic cancer.  She does not currently take any medications.   Upon admission into the emergency department patient was seen to be afebrile with pulse 41-48, respirations 20-24, and all other vital signs maintained.  Labs significant for WBC 13.2.  High-sensitivity troponins x2, LFTs, and lipase within normal limits.  CT angiogram of the chest/abdomen/pelvis was obtained which noted 2.3 cm lobulated pulmonary nodule left lower lobe, multiple noncalcified pulmonary nodules within the bilateral lungs, mural thickening of the ascending colon just distal to the ileocecal junction, cholelithiasis with a 3.1 cm laminated gallstone impacted within the gallbladder neck with distention of the gallbladder without clear signs of cholecystitis, and no signs of a dissection or aneurysm.  Ultrasound characterizes it as a 1.4 x 2.6 polypoid lesion in the apex of the gallbladder with flow demonstrated without signs of cholecystitis.  General surgery have been consulted.  Patient has been given antiemetics, morphine IV, and Rocephin.  [facility] accepted placed order for observation to telemetry surgical bed.    Hospital Course:  Principal Problem:   Impacted  gallstone of gallbladder Active Problems:   Leukocytosis   Sinus bradycardia   Essential hypertension   Pulmonary nodules   Mural thickening of colon   Prediabetes   Obesity (BMI 30-39.9)   Assessment and Plan: No notes have been filed under this hospital service. Service: Hospitalist  Impacted gallstone -Status post lap chole -doing well,  cleared to d/c home from gen surg, she is to follow up with gen surg   Leukocytosis  urine culture no significant growth blood culture in process  possible related to bronchitis seen on initial chest imaging , she does has congested cough reported has been going on for a few weeks, seen by pulmonology , she is treated with abx, clinically improving feeling better , wbc trending down , she desires to go home today, she received 4 days of iv rocephin in the hospital, will discharge on augmentinx1 day to finish total of 5 days abx treatment for bronchitis  She is to follow up with pcp and pulmonology, may need to repeat cxr if cough persists  constipation Treated, resolved     Mild elevation of creatinine UA trace leuk , rare bacteria ,urine culture negative for growth Encourage oral intake Repeat bmp at hospital discharge follow up   Mural thickening of colon Outpatient colonoscopy referral made    Lung nodule in non-smoker Seen by pulmonology, recommend outpatient follow-up       Body mass index is 42.91 kg/m..,  Meet class III obesity criteria Recommend life style modification    Discharge Exam: BP (!) 129/59 (BP Location: Right Arm)   Pulse (!) 59   Temp 98.5 F (36.9 C) (Oral)   Resp 17   Ht '5\' 4"'$  (1.626 m)   Wt 113.4 kg   LMP 03/17/2017   SpO2 95%   BMI 42.91 kg/m   General: NAD Cardiovascular: RRR Respiratory: normal respiratory effort     Discharge Instructions     Ambulatory referral to Gastroenterology   Complete by: As directed    Ascending colon abnormality   What is the reason for referral?: Colonoscopy   Diet - low sodium heart healthy   Complete by: As directed    Discharge wound care:   Complete by: As directed    May shower, do not submerge, no bathtub for two weeks   Increase activity slowly   Complete by: As directed       Allergies as of 08/23/2022   No Known Allergies      Medication List     TAKE these medications     amLODipine 10 MG tablet Commonly known as: NORVASC Take 1 tablet (10 mg total) by mouth daily.   amoxicillin-clavulanate 875-125 MG tablet Commonly known as: AUGMENTIN Take 1 tablet by mouth 2 (two) times daily for 1 day.   guaiFENesin 600 MG 12 hr tablet Commonly known as: MUCINEX Take 1 tablet (600 mg total) by mouth 2 (two) times daily.   ibuprofen 200 MG tablet Commonly known as: ADVIL Take 200 mg by mouth every 6 (six) hours as needed for mild pain.   polyethylene glycol 17 g packet Commonly known as: MIRALAX / GLYCOLAX Take 17 g by mouth daily.   senna-docusate 8.6-50 MG tablet Commonly known as: Senokot-S Take 1 tablet by mouth 2 (two) times daily.               Discharge Care Instructions  (From admission, onward)           Start  Ordered   08/23/22 0000  Discharge wound care:       Comments: May shower, do not submerge, no bathtub for two weeks   08/23/22 0910           No Known Allergies  Follow-up Information     Icard, Bradley L, DO Follow up.   Specialty: Pulmonary Disease Why: For lung nodule: we will call you with appt date/time Contact information: [place] [address] [phone]         Maczis, Carlena Hurl, PA-C Follow up.   Specialty: General Surgery Why: our office is scheduling you for follow up after your gallbladder surgery. please call to confirm appointment date/time. Contact information: [place] [address] [phone]         Nolene Ebbs, MD Follow up in 1 week(s).   Specialty: Internal Medicine Why: hospital discharge follow up , repeat basic labs works including cbc/bmp at follow up repeat chest x ray if cough persists, pcp to referral you to colonoscopy Contact information: [place] [address] [facility] Follow up.   Specialty: Gastroenterology Why: for colonoscopy Contact  information: [address] [place] [address] [phone]                 The results of significant diagnostics from this hospitalization (including imaging, microbiology, ancillary and laboratory) are listed below for reference.    Significant Diagnostic Studies: DG Cholangiogram Operative  Result Date: 08/20/2022 CLINICAL DATA:  66 female undergoing laparoscopic cholecystectomy EXAM: INTRAOPERATIVE CHOLANGIOGRAM TECHNIQUE: Cholangiographic images from the C-arm fluoroscopic device were submitted for interpretation post-operatively. Please see the procedural report for the amount of contrast and the fluoroscopy time utilized. COMPARISON:  CT abdomen pelvis from 08/20/2022 FINDINGS: Intraoperative antegrade injection via the cystic duct which opacifies the common bile duct and central portions of the intrahepatic biliary tree. Contrast is visualized flowing freely into the duodenum. There are no filling defects. No significant intra or extrahepatic biliary ductal dilation. No apparent anomalous anatomical configuration of the biliary tree. IMPRESSION: No evidence of choledocholithiasis. Ruthann Cancer, MD [facility] Radiology Electronically Signed   By: Ruthann Cancer M.D.   On: 08/20/2022 10:18   US Abdomen Limited RUQ (LIVER/GB)  Result Date: 08/20/2022 CLINICAL DATA:  Right upper quadrant pain. EXAM: ULTRASOUND ABDOMEN LIMITED RIGHT UPPER QUADRANT COMPARISON:  CT 08/20/2022 FINDINGS: Gallbladder: Cholelithiasis with 2.9 cm diameter stone in the gallbladder neck. Sludge in the gallbladder. Focal polypoid lesion at the apex of the gallbladder measuring 1.4 x 2.6 cm diameter and containing central flow on color flow Doppler imaging. This could represent neoplasm and based on size criteria, hepatic MRI is suggested for further evaluation. No gallbladder wall thickening or edema is otherwise identified. Murphy's  sign is negative. Common bile duct: Diameter: 6 mm, normal Liver: Increased parenchymal echotexture of the liver suggesting fatty infiltration. No focal mass identified. Portal vein is patent on color Doppler imaging with normal direction of blood flow towards the liver. Other: Incidental note of a 6 mm hyperechoic focus in the upper pole of the right kidney likely representing an angiomyolipoma. No shadowing is identified to suggest a stone. IMPRESSION: 1. Cholelithiasis without additional changes to suggest acute cholecystitis. 2. 1.4 x 2.6 cm polypoid lesion in the apex of the gallbladder with flow demonstrated. MRI is suggested for further characterization in the elective setting.  3. Diffuse fatty infiltration of the liver. 4. Hyperechoic lesion in the upper pole of the right kidney likely represents angiomyolipoma. Nonshadowing stone would be a secondary possibility. Electronically Signed   By: Lucienne Capers M.D.   On: 08/20/2022 04:00   CT Angio Chest/Abd/Pel for Dissection W and/or Wo Contrast  Result Date: 08/20/2022 CLINICAL DATA:  Chest pain or back pain, aortic dissection suspected. Dyspnea, nausea, vomiting, diarrhea. EXAM: CT ANGIOGRAPHY CHEST, ABDOMEN AND PELVIS TECHNIQUE: Non-contrast CT of the chest was initially obtained. Multidetector CT imaging through the chest, abdomen and pelvis was performed using the standard protocol during bolus administration of intravenous contrast. Multiplanar reconstructed images and MIPs were obtained and reviewed to evaluate the vascular anatomy. RADIATION DOSE REDUCTION: This exam was performed according to the departmental dose-optimization program which includes automated exposure control, adjustment of the mA and/or kV according to patient size and/or use of iterative reconstruction technique. CONTRAST:  3m OMNIPAQUE IOHEXOL 350 MG/ML SOLN COMPARISON:  None Available. FINDINGS: CTA CHEST FINDINGS Cardiovascular: Preferential opacification of the thoracic  aorta. No evidence of thoracic aortic aneurysm or dissection. Normal heart size. No pericardial effusion. Mediastinum/Nodes: Visualized thyroid is unremarkable. No pathologic thoracic adenopathy. Esophagus unremarkable. Lungs/Pleura: Lobulated pulmonary nodule is seen within the left lower lobe measuring 2.1 x 2.2 x 2.3 cm at axial image # 95/8 and coronal image # 115/9. The mass occludes a subsegmental bronchus of the posterior basal segment of the left lower lobe. 6 mm mean diameter noncalcified pulmonary nodule within the right middle lobe, axial image # 92, series 8, indeterminate. Numerous 3-5 mm noncalcified pulmonary nodules are identified within the left lower lobe at axial image # 72, left upper lobe at axial image # 72, # 71, and # 54, and within the right upper lobe at axial image # 68, series 8. These are indeterminate though their multiplicity favors the sequela of a remote infectious or inflammatory process. Mosaic attenuation of the pulmonary parenchyma is in keeping with multifocal air trapping related to probable small airways disease. No pneumothorax or pleural effusion. Central airways are widely patent. Musculoskeletal: No acute bone abnormality. Healed fracture deformity of the mid diaphysis of the left clavicle. No lytic or blastic bone lesion. Review of the MIP images confirms the above findings. CTA ABDOMEN AND PELVIS FINDINGS VASCULAR Aorta: Normal caliber aorta without aneurysm, dissection, vasculitis or significant stenosis. Celiac: Patent without evidence of aneurysm, dissection, vasculitis or significant stenosis. SMA: Patent without evidence of aneurysm, dissection, vasculitis or significant stenosis. Renals: Dual renal arteries bilaterally. Wide patency. Normal vascular morphology. No aneurysm or dissection. IMA: Patent without evidence of aneurysm, dissection, vasculitis or significant stenosis. Inflow: Patent without evidence of aneurysm, dissection, vasculitis or significant  stenosis. Veins: No obvious venous abnormality within the limitations of this arterial phase study. Review of the MIP images confirms the above findings. NON-VASCULAR Hepatobiliary: 3.1 cm lamellated gallstone is seen impacted within the a gallbladder neck. The gallbladder is distended. No superimposed pericholecystic inflammatory changes, however, are identified to suggest changes of acute cholecystitis. Liver unremarkable. No intra or extrahepatic biliary ductal dilation. Pancreas: Unremarkable Spleen: Unremarkable Adrenals/Urinary Tract: Adrenal glands are unremarkable. Kidneys are normal, without renal calculi, focal lesion, or hydronephrosis. Bladder is unremarkable. Stomach/Bowel: There is eccentric mural thickening involving the ascending colon just distal to the ileocecal junction and an eccentric mural mass is not excluded. There is no evidence of obstruction or pericolonic infiltration. The stomach, small bowel, and large bowel are otherwise unremarkable. The appendix is normal. No free  intraperitoneal gas or fluid. Lymphatic: No pathologic adenopathy within the abdomen and pelvis. Reproductive: Lobulated appearance of the uterus may relate to underlying uterine fibroids. The pelvic organs are otherwise unremarkable. Other: No abdominal wall hernia. Musculoskeletal: No acute bone abnormality. No lytic or blastic bone lesion. Review of the MIP images confirms the above findings. IMPRESSION: 1. No evidence of thoracic or abdominal aortic aneurysm or dissection. 2. 2.3 cm lobulated pulmonary nodule within the left lower lobe, suspicious for a primary pulmonary malignancy. Consider one of the following for both low-risk and high-risk individuals: (a) repeat chest CT in 3 months, (b) PET-CT, or (c) tissue sampling. This recommendation follows the consensus statement: Guidelines for Management of Incidental Pulmonary Nodules Detected on CT Images: From the Fleischner Society 2017; Radiology 2017; 284:228-243. 3.  Multiple additional 3-5 mm noncalcified pulmonary nodules within the lungs bilaterally. These are indeterminate though their multiplicity favors the sequela of a remote infectious or inflammatory process. No follow-up needed if patient is low-risk (and has no known or suspected primary neoplasm). Non-contrast chest CT can be considered in 12 months if patient is high-risk. This recommendation follows the consensus statement: Guidelines for Management of Incidental Pulmonary Nodules Detected on CT Images: From the Fleischner Society 2017; Radiology 2017; 284:228-243. 4. Eccentric mural thickening involving the ascending colon just distal to the ileocecal junction. While this may be related to underdistention, an eccentric mural mass is not excluded. Correlation with endoscopy is recommended. 5. Cholelithiasis with a 3.1 cm lamellated gallstone impacted within the gallbladder neck. The gallbladder is distended though no superimposed pericholecystic inflammatory changes are identified to suggest acute cholecystitis. If there is clinical concern for acute cholecystitis, right upper quadrant sonography may be helpful for further evaluation. 6. Mosaic attenuation of the pulmonary parenchyma in keeping with multifocal air trapping related to small airways disease. Electronically Signed   By: Fidela Salisbury M.D.   On: 08/20/2022 02:56   DG Chest 1 View  Result Date: 08/20/2022 CLINICAL DATA:  Chest pain and shortness of breath EXAM: CHEST  1 VIEW COMPARISON:  07/02/2021 FINDINGS: Heart size and pulmonary vascularity are normal. Bronchial wall thickening and bronchiectasis demonstrated in the left lower lung similar to prior study. No developing consolidation or edema. No pleural effusions. No pneumothorax. Mediastinal contours appear intact. IMPRESSION: Bronchiectasis and bronchial wall thickening in the left lower lung. No developing consolidation. Electronically Signed   By: Lucienne Capers M.D.   On: 08/20/2022  01:28    Microbiology: Recent Results (from the past 240 hour(s))  Urine Culture     Status: Abnormal   Collection Time: 08/21/22 10:03 AM   Specimen: Urine, Clean Catch  Result Value Ref Range Status   Specimen Description URINE, CLEAN CATCH  Final   Special Requests NONE  Final   Culture (A)  Final    <10,000 COLONIES/mL INSIGNIFICANT GROWTH Performed at [facility], [address], [place], [place] [address]    Report Status 08/22/2022 FINAL  Final  Expectorated Sputum Assessment w Gram Stain, Rflx to Resp Cult     Status: None   Collection Time: 08/22/22  3:34 PM   Specimen: Expectorated Sputum  Result Value Ref Range Status   Specimen Description EXPECTORATED SPUTUM  Final   Special Requests NONE  Final   Sputum evaluation   Final    Sputum specimen not acceptable for testing.  Please recollect.   Gram Stain Report Called to,Read Back By and Verified With: RN ABurnis Kingfisher [phone] '@2300'$  [place] Performed  at [facility], [employer] [address], [place], [place] [address]    Report Status 08/22/2022 FINAL  Final     Labs: Basic Metabolic Panel: Recent Labs  Lab 08/20/22 0108 08/20/22 0146 08/21/22 0043 08/22/22 0201 08/23/22 0227  NA 138 140 136 136 139  K 3.7 3.6 3.9 3.9 3.8  CL 103 101 102 105 104  CO2 25  --  '25 24 24  '$ GLUCOSE 157* 152* 147* 142* 117*  BUN '10 10 14 15 12  '$ CREATININE 1.05* 1.00 1.19* 1.02* 1.07*  CALCIUM 8.9  --  8.6* 8.1* 8.5*   Liver Function Tests: Recent Labs  Lab 08/20/22 0108 08/21/22 0043  AST 23 39  ALT 14 21  ALKPHOS 58 52  BILITOT 1.1 0.6  PROT 7.6 6.9  ALBUMIN 3.8 3.3*   Recent Labs  Lab 08/20/22 0108  LIPASE 40   No results for input(s): "AMMONIA" in the last 168 hours. CBC: Recent Labs  Lab 08/20/22 0108 08/20/22 0146 08/21/22 0043 08/22/22 0201 08/23/22 0227  WBC 13.2*  --  17.7* 15.1* 12.2*  NEUTROABS 10.6*  --   --  11.5* 8.4*  HGB 13.5 13.9 12.4 12.2 11.6*  HCT 40.0 41.0 37.4 36.6 35.8*  MCV 84.9  --   86.4 86.3 86.5  PLT 284  --  252 233 219   Cardiac Enzymes: No results for input(s): "CKTOTAL", "CKMB", "CKMBINDEX", "TROPONINI" in the last 168 hours. BNP: BNP (last 3 results) No results for input(s): "BNP" in the last 8760 hours.  ProBNP (last 3 results) No results for input(s): "PROBNP" in the last 8760 hours.  CBG: No results for input(s): "GLUCAP" in the last 168 hours.  FURTHER DISCHARGE INSTRUCTIONS:   Get Medicines reviewed and adjusted: Please take all your medications with you for your next visit with your Primary MD   Laboratory/radiological data: Please request your Primary MD to go over all hospital tests and procedure/radiological results at the follow up, please ask your Primary MD to get all Hospital records sent to his/her office.   In some cases, they will be blood work, cultures and biopsy results pending at the time of your discharge. Please request that your primary care M.D. goes through all the records of your hospital data and follows up on these results.   Also Note the following: If you experience worsening of your admission symptoms, develop shortness of breath, life threatening emergency, suicidal or homicidal thoughts you must seek medical attention immediately by calling 911 or calling your MD immediately  if symptoms less severe.   You must read complete instructions/literature along with all the possible adverse reactions/side effects for all the Medicines you take and that have been prescribed to you. Take any new Medicines after you have completely understood and accpet all the possible adverse reactions/side effects.    Do not drive when taking Pain medications or sleeping medications (Benzodaizepines)   Do not take more than prescribed Pain, Sleep and Anxiety Medications. It is not advisable to combine anxiety,sleep and pain medications without talking with your primary care practitioner   Special Instructions: If you have smoked or chewed  Tobacco  in the last 2 yrs please stop smoking, stop any regular Alcohol  and or any Recreational drug use.   Wear Seat belts while driving.   Please note: You were cared for by a hospitalist during your hospital stay. Once you are discharged, your primary care physician will handle any further medical issues. Please note that NO  REFILLS for any discharge medications will be authorized once you are discharged, as it is imperative that you return to your primary care physician (or establish a relationship with a primary care physician if you do not have one) for your post hospital discharge needs so that they can reassess your need for medications and monitor your lab values.     Signed:  Florencia Reasons MD, PhD, FACP  [facility] 08/23/2022, 9:54 AM

## 2022-08-23 NOTE — Progress Notes (Signed)
Mobility Specialist - Progress Note   08/23/22 1019  Mobility  Activity Ambulated with assistance in hallway  Level of Assistance Standby assist, set-up cues, supervision of patient - no hands on  Assistive Device Other (Comment) (IV Pole)  Distance Ambulated (ft) 200 ft  Activity Response Tolerated well  $Mobility charge 1 Mobility    Pt received in bed agreeable to mobility. Left in room w/ call bell in reach and all needs met.   Paulla Dolly Mobility Specialist

## 2022-08-23 NOTE — Plan of Care (Signed)

## 2022-08-27 ENCOUNTER — Telehealth: Payer: Self-pay | Admitting: *Deleted

## 2022-08-27 LAB — CULTURE, BLOOD (ROUTINE X 2)
Culture: NO GROWTH
Culture: NO GROWTH
Special Requests: ADEQUATE
Special Requests: ADEQUATE

## 2022-08-27 NOTE — Telephone Encounter (Signed)
Patient returned call and was scheduled 09/05/22 with Dr. Lamonte Sakai.

## 2022-08-27 NOTE — Telephone Encounter (Signed)
ATC patient to schedule hospital follow up for lung nodule. LM for patient to call office to schedule.  Will follow up.   Marshell Garfinkel, MD  [facility] [address] [place], Vilinda Blanks, LPN Can you please set up a lung nodule hospital follow up visit with either Meir, Byrum or Icard?   Thanks  Praveen        Previous Messages    ----- Message -----  From: Candee Furbish, MD  Sent: 08/27/2022   8:57 AM EDT  To: Marshell Garfinkel, MD  Subject: FW: Lung nodule: needs ENB                      ----- Message -----  From: Candee Furbish, MD  Sent: 08/20/2022   3:02 PM EDT  To: Maryjane Hurter, MD; Collene Gobble, MD  Subject: Lung nodule: needs ENB                         Maybe carcinoid?  Nonsmoker and looks like an airway leads right to it.  Will leave to you guys to set up.  I wrote a consult note so you can just set up procedure: she's good with it.   Linna Hoff

## 2022-08-31 ENCOUNTER — Telehealth: Payer: Self-pay | Admitting: Student

## 2022-08-31 DIAGNOSIS — R918 Other nonspecific abnormal finding of lung field: Secondary | ICD-10-CM

## 2022-08-31 NOTE — Telephone Encounter (Signed)
-----   Message from Collene Gobble, MD sent at 08/28/2022  5:02 PM EDT ----- Regarding: RE: Lung nodule: needs ENB I wouldn't be able to schedule her for procedure until 11/6, so if you could get her quicker it would be ok w me. Otherwise I can see her as planned. Thanks for offering  Rob  ----- Message ----- From: Maryjane Hurter, MD Sent: 08/28/2022   4:40 PM EDT To: Collene Gobble, MD Subject: FW: Lung nodule: needs ENB                     Hey looks like she got scheduled with you in clinic - I've still got room on my schedule and I'm glad to arrange procedure for her but if you've got room on your schedule and you'd prefer to see her in clinic then no sweat!  Let me know what you prefer  Nate ----- Message ----- From: Candee Furbish, MD Sent: 08/20/2022   3:02 PM EDT To: Maryjane Hurter, MD; Collene Gobble, MD Subject: Lung nodule: needs ENB                         Maybe carcinoid?  Nonsmoker and looks like an airway leads right to it. Will leave to you guys to set up.  I wrote a consult note so you can just set up procedure: she's good with it.  Linna Hoff

## 2022-08-31 NOTE — Telephone Encounter (Signed)
Will try to schedule robotic nav for 10/25 or 11/2. Called and discussed nature of procedure, typical diagnostic yield, risks of respiratory failure <5%, pneumothorax needing chest tube 1%, major bleeding 11/998. Will need Super D CT Chest before procedure which I have ordered.   We can cancel her clinic appointment 10/25.

## 2022-09-03 ENCOUNTER — Other Ambulatory Visit: Payer: Self-pay | Admitting: Internal Medicine

## 2022-09-03 ENCOUNTER — Telehealth: Payer: Self-pay | Admitting: Student

## 2022-09-03 ENCOUNTER — Encounter: Payer: Self-pay | Admitting: Student

## 2022-09-03 DIAGNOSIS — R7303 Prediabetes: Secondary | ICD-10-CM | POA: Diagnosis not present

## 2022-09-03 DIAGNOSIS — Z0000 Encounter for general adult medical examination without abnormal findings: Secondary | ICD-10-CM | POA: Diagnosis not present

## 2022-09-03 DIAGNOSIS — Z1239 Encounter for other screening for malignant neoplasm of breast: Secondary | ICD-10-CM | POA: Diagnosis not present

## 2022-09-03 DIAGNOSIS — Z13220 Encounter for screening for lipoid disorders: Secondary | ICD-10-CM | POA: Diagnosis not present

## 2022-09-03 DIAGNOSIS — E559 Vitamin D deficiency, unspecified: Secondary | ICD-10-CM | POA: Diagnosis not present

## 2022-09-03 NOTE — Telephone Encounter (Signed)
Bronch   11/2 at 9:15am  [facility]   CT 10/31 at 9:30am  [facility]   Attempted to inform pt of appt info but vm box was full

## 2022-09-04 ENCOUNTER — Telehealth: Payer: Self-pay

## 2022-09-04 LAB — CBC
HCT: 39.8 % (ref 35.0–45.0)
Hemoglobin: 13.1 g/dL (ref 11.7–15.5)
MCH: 28.5 pg (ref 27.0–33.0)
MCHC: 32.9 g/dL (ref 32.0–36.0)
MCV: 86.5 fL (ref 80.0–100.0)
MPV: 11.4 fL (ref 7.5–12.5)
Platelets: 339 10*3/uL (ref 140–400)
RBC: 4.6 10*6/uL (ref 3.80–5.10)
RDW: 13.6 % (ref 11.0–15.0)
WBC: 7.4 10*3/uL (ref 3.8–10.8)

## 2022-09-04 LAB — COMPLETE METABOLIC PANEL WITH GFR
AG Ratio: 1.3 (calc) (ref 1.0–2.5)
ALT: 9 U/L (ref 6–29)
AST: 18 U/L (ref 10–35)
Albumin: 4.3 g/dL (ref 3.6–5.1)
Alkaline phosphatase (APISO): 72 U/L (ref 37–153)
BUN: 15 mg/dL (ref 7–25)
CO2: 22 mmol/L (ref 20–32)
Calcium: 9.1 mg/dL (ref 8.6–10.4)
Chloride: 107 mmol/L (ref 98–110)
Creat: 0.94 mg/dL (ref 0.50–1.03)
Globulin: 3.3 g/dL (calc) (ref 1.9–3.7)
Glucose, Bld: 90 mg/dL (ref 65–99)
Potassium: 4.3 mmol/L (ref 3.5–5.3)
Sodium: 141 mmol/L (ref 135–146)
Total Bilirubin: 0.7 mg/dL (ref 0.2–1.2)
Total Protein: 7.6 g/dL (ref 6.1–8.1)
eGFR: 71 mL/min/{1.73_m2} (ref >=60)

## 2022-09-04 LAB — LIPID PANEL
Cholesterol: 164 mg/dL (ref <200)
HDL: 45 mg/dL — ABNORMAL LOW (ref >=50)
LDL Cholesterol (Calc): 95 mg/dL (calc)
Non-HDL Cholesterol (Calc): 119 mg/dL (calc) (ref <130)
Total CHOL/HDL Ratio: 3.6 (calc) (ref <5.0)
Triglycerides: 137 mg/dL (ref <150)

## 2022-09-04 LAB — VITAMIN D 25 HYDROXY (VIT D DEFICIENCY, FRACTURES): Vit D, 25-Hydroxy: 12 ng/mL — ABNORMAL LOW (ref 30–100)

## 2022-09-04 LAB — TSH: TSH: 3.7 mIU/L (ref 0.40–4.50)

## 2022-09-04 NOTE — Telephone Encounter (Signed)
ATC voicemail full. If pt calls back PCCs need to speak with her and OV needs to be canceled for 09/05/22.

## 2022-09-05 ENCOUNTER — Institutional Professional Consult (permissible substitution): Payer: BC Managed Care – PPO | Admitting: Emergency Medicine

## 2022-09-07 ENCOUNTER — Telehealth: Payer: Self-pay | Admitting: Student

## 2022-09-07 NOTE — Telephone Encounter (Signed)
Pt cancelled Bronch and CT. I've called her twice to resch and I have not received an response. May need additional alternate dates from provider when Kendra Castro returns.

## 2022-09-11 ENCOUNTER — Ambulatory Visit (HOSPITAL_COMMUNITY): Payer: BC Managed Care – PPO

## 2022-09-13 ENCOUNTER — Encounter (HOSPITAL_COMMUNITY): Admission: RE | Payer: Self-pay | Source: Home / Self Care

## 2022-09-13 ENCOUNTER — Ambulatory Visit (HOSPITAL_COMMUNITY): Admission: RE | Admit: 2022-09-13 | Payer: BC Managed Care – PPO | Source: Home / Self Care | Admitting: Student

## 2022-09-13 SURGERY — BRONCHOSCOPY, WITH BIOPSY USING ELECTROMAGNETIC NAVIGATION
Anesthesia: General

## 2022-10-01 IMAGING — DX DG FOOT COMPLETE 3+V*R*
3 series · 3 of 3 positions shown · non-contrast
Comparison: 12/20/2014

CLINICAL DATA: Both pain and cramping for 3 days.

EXAM:
RIGHT FOOT COMPLETE - 3+ VIEW

[foot ap]
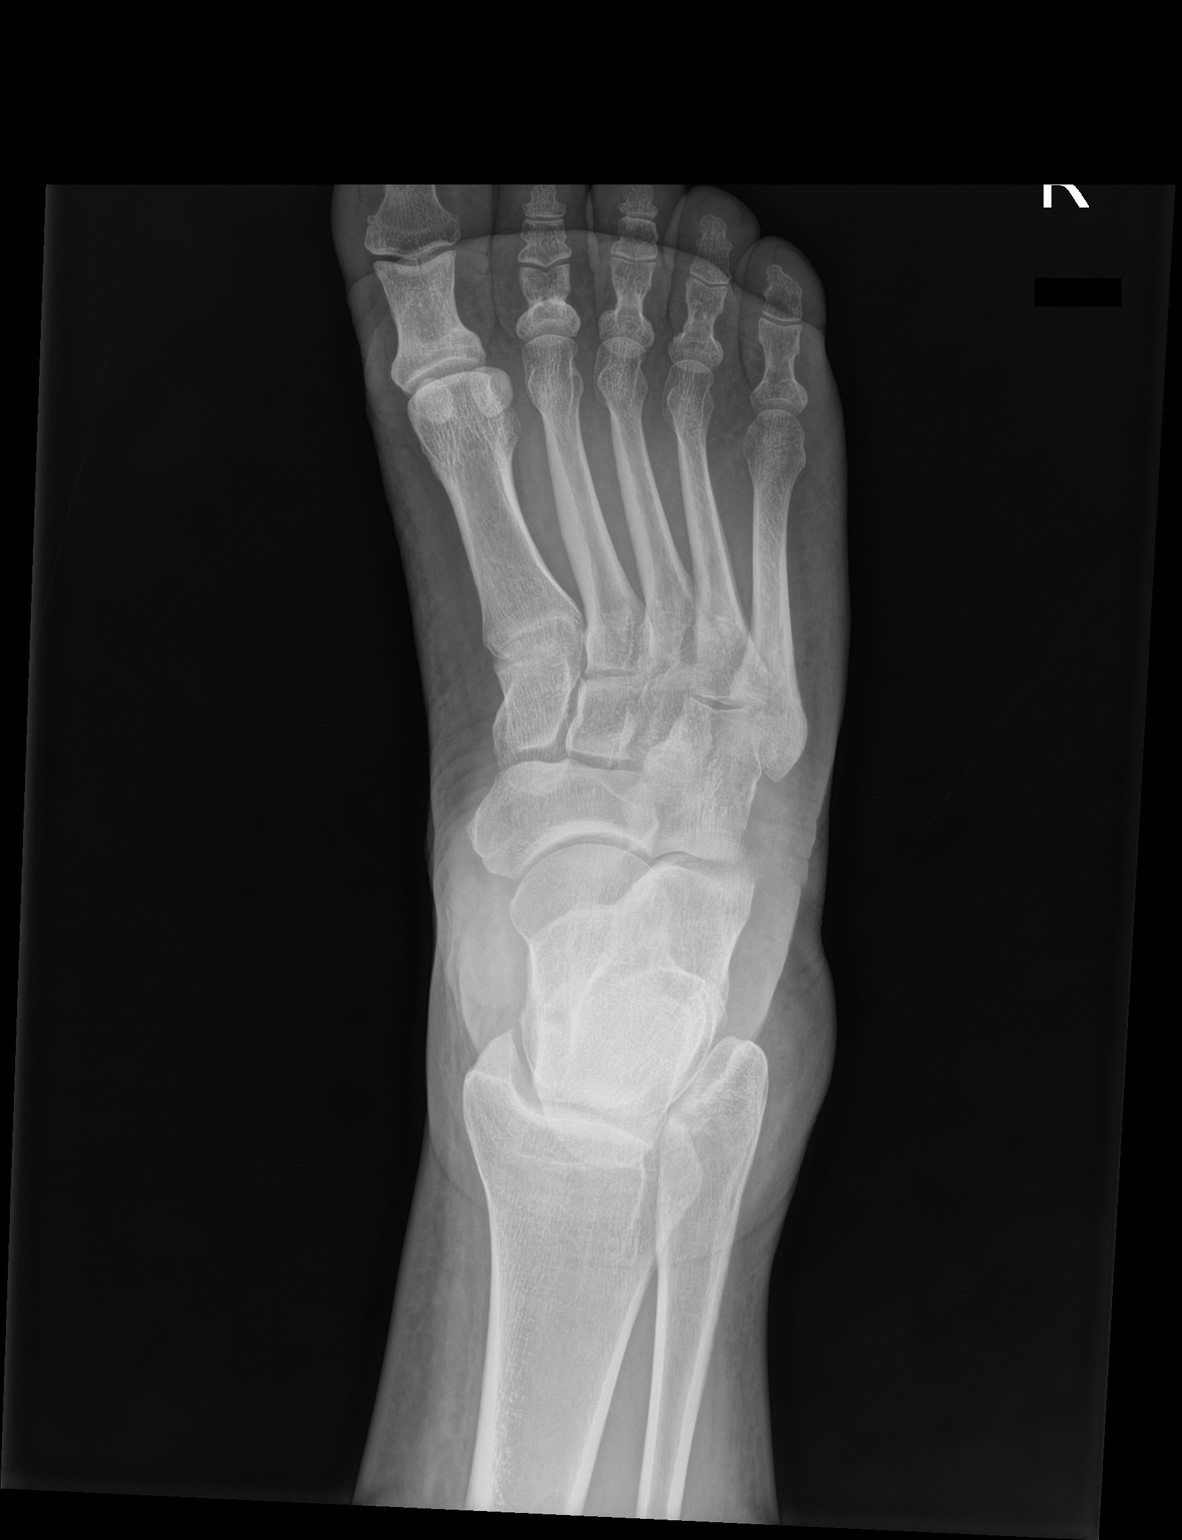

[foot obl]
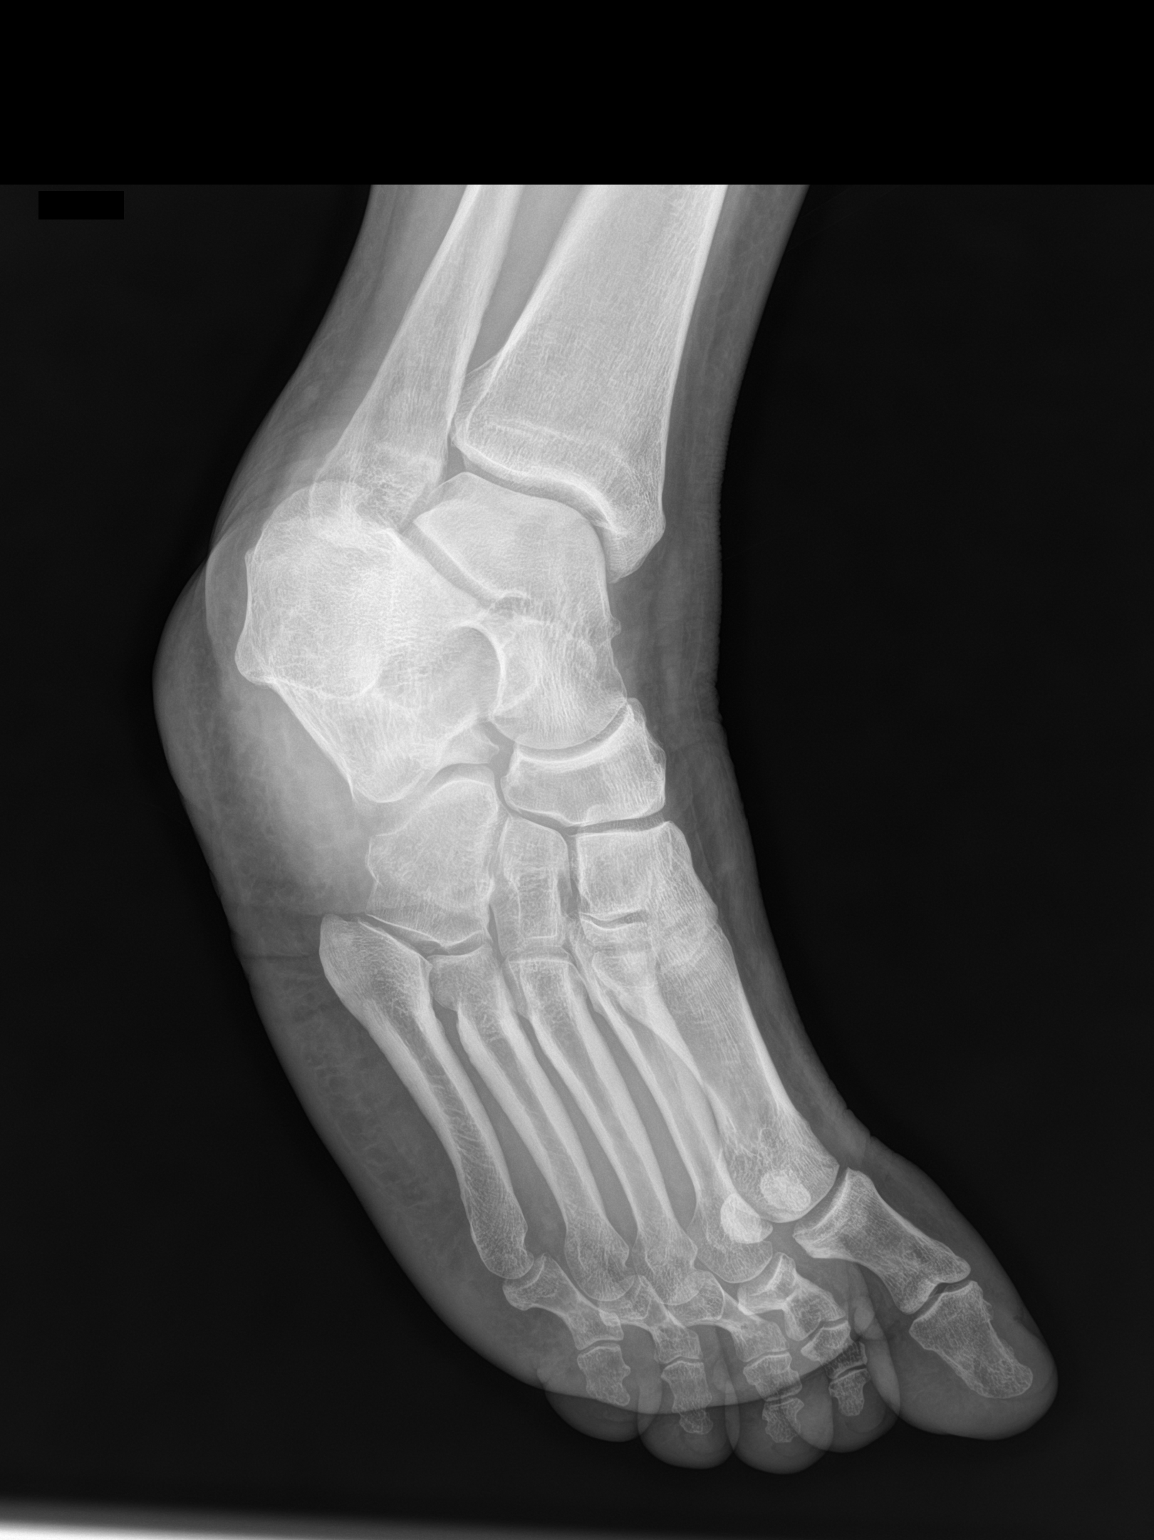

[foot lat]
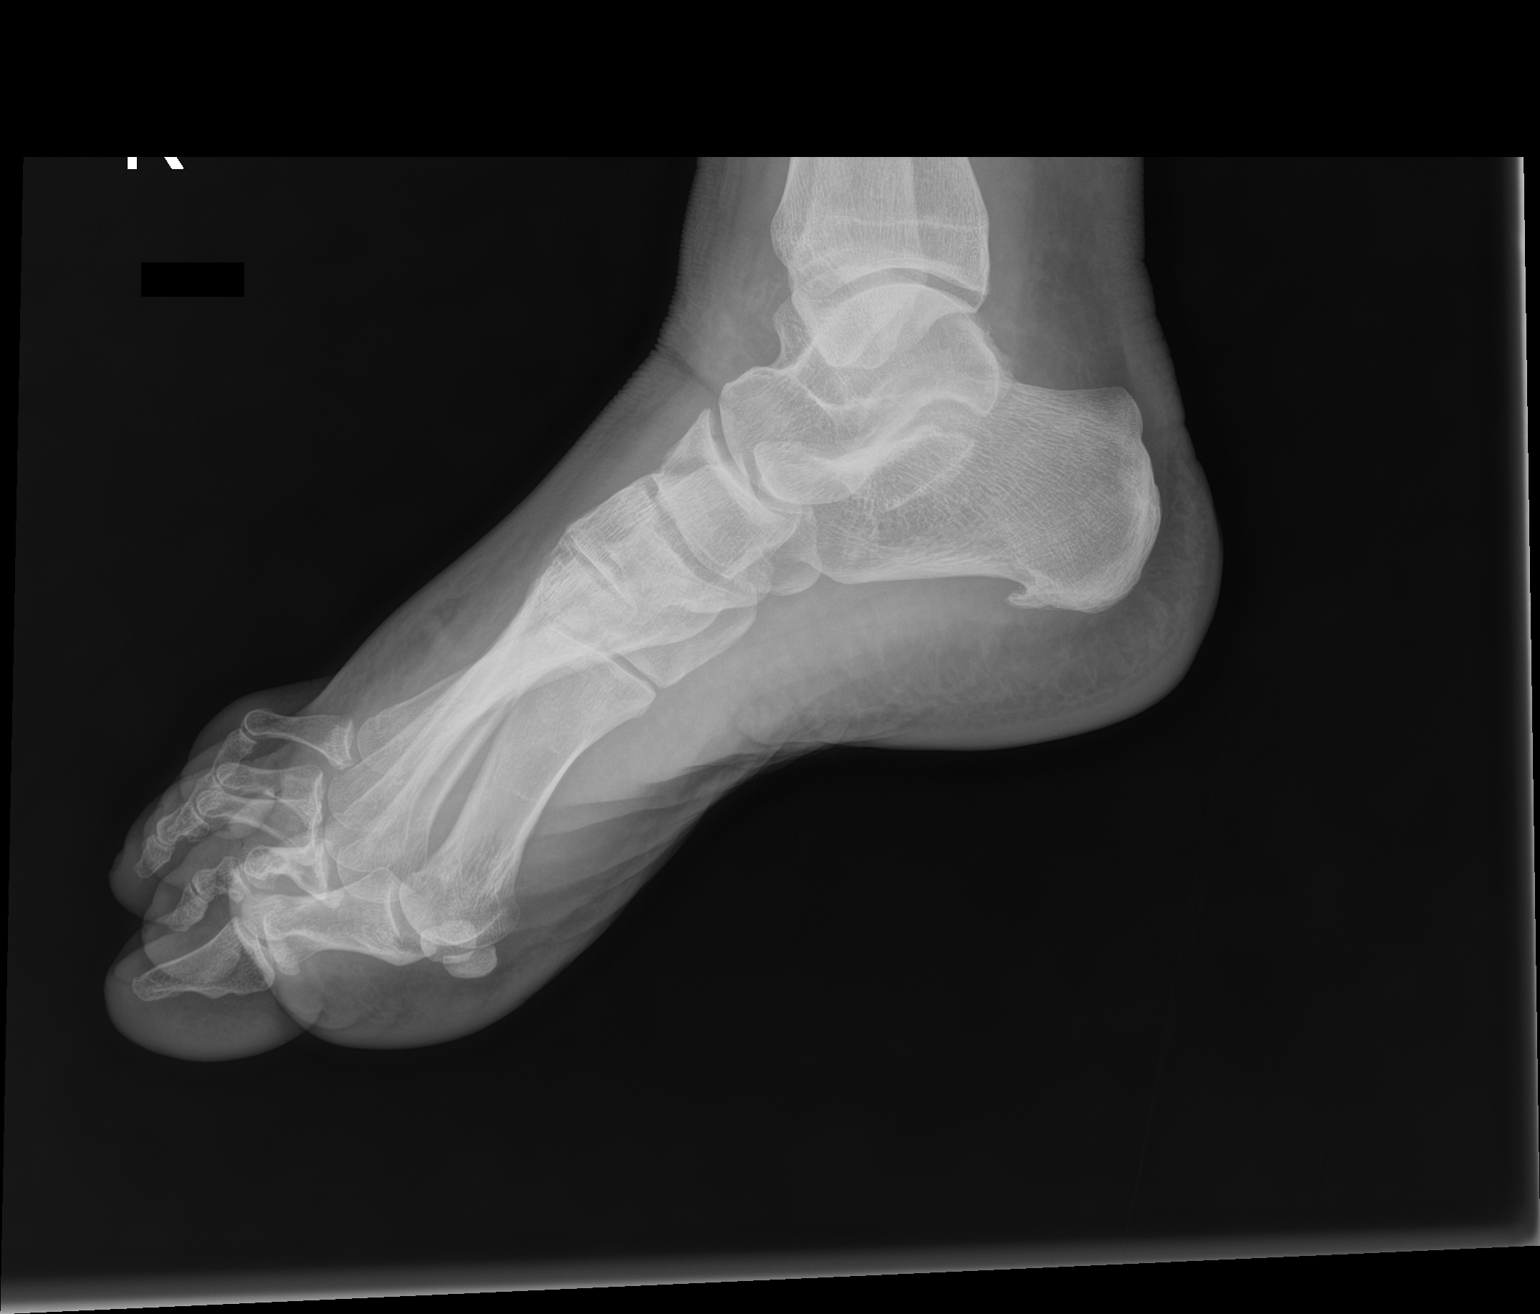

[3 of 3 positions shown; findings below may reference images not displayed]

FINDINGS: There is no evidence of fracture or dislocation. There is no
evidence of arthropathy or other focal bone abnormality. Soft
tissues are unremarkable. Small plantar calcaneal spur is present.
IMPRESSION: Negative.

## 2023-10-21 ENCOUNTER — Encounter: Payer: Self-pay | Admitting: Pediatrics

## 2023-12-03 ENCOUNTER — Encounter: Payer: Self-pay | Admitting: Obstetrics and Gynecology

## 2023-12-31 ENCOUNTER — Ambulatory Visit: Payer: Self-pay | Admitting: Pediatrics

## 2024-09-11 ENCOUNTER — Encounter: Payer: Self-pay | Admitting: Physician Assistant

## 2024-09-18 DIAGNOSIS — R0609 Other forms of dyspnea: Secondary | ICD-10-CM | POA: Insufficient documentation

## 2024-09-18 DIAGNOSIS — R0683 Snoring: Secondary | ICD-10-CM | POA: Insufficient documentation

## 2024-09-29 ENCOUNTER — Ambulatory Visit: Payer: Self-pay | Admitting: Surgery

## 2024-09-29 DIAGNOSIS — N393 Stress incontinence (female) (male): Secondary | ICD-10-CM | POA: Insufficient documentation

## 2024-09-29 DIAGNOSIS — K641 Second degree hemorrhoids: Secondary | ICD-10-CM | POA: Insufficient documentation

## 2024-11-22 NOTE — Progress Notes (Signed)
 "    Memory Concerns of unclear etiology, likely multifactorial  Kendra Castro is a very pleasant 59 y.o. year old RH female with a history of hypertension, hyperlipidemia, BPPV, hypothyroidism, depression, mild sinus bradycardia, seen today for evaluation of memory  concerns. MoCA today is 26 /30 . Etiology is unclear, low suspicion for neurodegenerative disease, likely multifactorial. Patient is able to participate on ADLs and to drive without difficulties. Mood is depressed, tearful. Patient is here alone.     MRI brain without contrast to assess for underlying structural abnormality and assess vascular load  Neurocognitive testing to further evaluate cognitive concerns and determine other underlying cause of memory changes, including potential contribution from sleep, anxiety, attention, or depression  Check B12, TSH EEG for TAA Continue to control mood as per PCP Referral to [facility] for depression. Follow-up for OSA with specialty. Recommend good control of cardiovascular risk factors  Follow up pending on the above results  Discussed the use of AI scribe software for clinical note transcription with the patient, who gave verbal consent to proceed.  History of Present Illness Kendra Castro is a 59 year old female who presents with worsening memory issues over the past two years.  She has experienced lifelong memory difficulties, particularly with short-term memory, such as retaining new information, conversations, and names. She has always needed to take extensive notes and now writes emails to herself to remember appointments. Over the past two years, these memory issues have worsened, including episodes of disorientation while driving, misplacing items, and occasionally repeating herself. Over the past six months, she has had a decreased desire to groom herself and sometimes forgets to take her medications.  She has a history of depression and has seen a psychotherapist in the  past. She experiences stress and emotional issues, which she feels may be affecting her memory. She has not been taking any medication for mood.  She experiences stress eating and does not drink enough water . She is unemployed and experiencing financial difficulties. She is currently taking classes at [employer] for an associate's degree in company secretary. She lives with her father and a roommate  and her father who also has some memory issues. She is a second-hand smoker due to her roommate's smoking.  She is being evaluated for sleep apnea, with an appointment scheduled in April. She does not feel refreshed upon waking and attributes this to environmental disturbances at night, including noise from dogs and her roommate.  Denies any history of migraine headaches.  She has not had her glasses checked in over two years.  She experiences chronic shortness of breath and can walk about 200 yards before experiencing difficulty.She has a history of Bell's palsy and benign positional vertigo, which sometimes causes nausea with sudden movements. Denies any symptoms of strokes, history of seizures, or major head injuries. No hallucinations or paranoia, although she sometimes reports imagining things.      She has stress incontinence and symptomatic hemorrhoids, with surgery scheduled for February 6th. She has mild constipation.     Past Medical History:  Diagnosis Date   Allergic rhinitis    Bell's palsy 2020   Constipation    Hirsutism    Hypertension    Idiopathic gout of right ankle    Idiopathic gout of right foot    Obesity    Uterine fibroid    Vertigo, benign positional      Past Surgical History:  Procedure Laterality Date   CHOLECYSTECTOMY N/A 08/20/2022  Procedure: LAPAROSCOPIC CHOLECYSTECTOMY WITH INTRAOPERATIVE CHOLANGIOGRAM;  Surgeon: Belinda Cough, MD;  Location: [facility] OR;  Service: General;  Laterality: N/A;   CYST EXCISION     Lower back   LAPAROSCOPIC ABDOMINAL  EXPLORATION N/A 08/20/2022   Procedure: DIAGNOSTIC LAPAROSCOPY;  Surgeon: Belinda Cough, MD;  Location: [facility] OR;  Service: General;  Laterality: N/A;     Allergies[1]  Current Outpatient Medications  Medication Instructions   amLODipine  (NORVASC ) 10 mg, Oral, Daily   guaiFENesin  (MUCINEX ) 600 mg, Oral, 2 times daily   ibuprofen  (ADVIL ) 200 mg, Every 6 hours PRN   levothyroxine (SYNTHROID) 50 mcg, Daily before breakfast   losartan-hydrochlorothiazide (HYZAAR) 100-25 MG tablet 1 tablet, Daily   polyethylene glycol (MIRALAX  / GLYCOLAX ) 17 g, Oral, Daily   senna-docusate (SENOKOT-S) 8.6-50 MG tablet 1 tablet, Oral, 2 times daily   Vitamin D  (Ergocalciferol ) (DRISDOL) 50,000 Units     VITALS:   Vitals:   11/23/24 0805  BP: 128/73  Pulse: 66  Resp: 20  SpO2: 97%  Weight: 253 lb (114.8 kg)  Height: 5' 4 (1.626 m)         11/23/2024    9:00 AM  Montreal Cognitive Assessment   Visuospatial/ Executive (0/5) 4  Naming (0/3) 3  Attention: Read list of digits (0/2) 2  Attention: Read list of letters (0/1) 1  Attention: Serial 7 subtraction starting at 100 (0/3) 3  Language: Repeat phrase (0/2) 2  Language : Fluency (0/1) 1  Abstraction (0/2) 2  Delayed Recall (0/5) 2  Orientation (0/6) 6  Total 26  Adjusted Score (based on education) 26        No data to display           Neurological Exam    Orientation:  Alert and oriented to person, place and time. No aphasia or dysarthria. Fund of knowledge is appropriate. Recent memory impaired and remote memory intact.  Attention and concentration are normal.  Able to name objects and repeat phrases. Delayed recall  2/5 (note that was 0 when presented with words, then with some time given, was able to recall 2 of them) Cranial nerves: There is some facial asymmetry, with chronic right facial droop (history of Bell's palsy).  Extraocular muscles are intact and visual fields are full to confrontational testing. Speech is fluent and  clear. No tongue deviation. Hearing is intact to conversational tone. Tone: Tone is good throughout. Abnormal movements: No tremors. No Asterixis. No Fasciculations Sensation: Sensation is intact to light touch. Vibration is intact at the bilateral big toe.  Coordination: The patient has no difficulty with RAM's or FNF bilaterally. Normal finger to nose  Motor: Strength is 5/5 in the bilateral upper and lower extremities. There is no pronator drift. There are no fasciculations noted. DTR's: Deep tendon reflexes are 2/4 bilaterally. Gait and Station: The patient is able to ambulate without difficulty, but walks slowly. The patient is able to heel toe walk. Gait is cautious and narrow. The patient is able to ambulate in a tandem fashion.       Thank you for allowing us  the opportunity to participate in the care of this nice patient. Please do not hesitate to contact us  for any questions or concerns.   Total time spent on today's visit was 60 minutes dedicated to this patient today, preparing to see patient, examining the patient, ordering tests and/or medications and counseling the patient, documenting clinical information in the EHR or other health record, independently interpreting results and communicating results  to the patient/family, discussing treatment and goals, answering patient's questions and coordinating care.  Cc:  Cox, Geni LABOR, NP  Camie Sevin 11/23/2024 9:25 AM       [1] No Known Allergies  "

## 2024-11-23 ENCOUNTER — Encounter: Payer: Self-pay | Admitting: Physician Assistant

## 2024-11-23 ENCOUNTER — Ambulatory Visit (INDEPENDENT_AMBULATORY_CARE_PROVIDER_SITE_OTHER): Admitting: Physician Assistant

## 2024-11-23 ENCOUNTER — Ambulatory Visit

## 2024-11-23 ENCOUNTER — Encounter: Payer: Self-pay | Admitting: Psychology

## 2024-11-23 VITALS — BP 128/73 | HR 66 | Resp 20 | Ht 64.0 in | Wt 253.0 lb

## 2024-11-23 DIAGNOSIS — F32A Depression, unspecified: Secondary | ICD-10-CM | POA: Insufficient documentation

## 2024-11-23 DIAGNOSIS — K625 Hemorrhage of anus and rectum: Secondary | ICD-10-CM | POA: Insufficient documentation

## 2024-11-23 DIAGNOSIS — E039 Hypothyroidism, unspecified: Secondary | ICD-10-CM | POA: Insufficient documentation

## 2024-11-23 DIAGNOSIS — Z860101 Personal history of adenomatous and serrated colon polyps: Secondary | ICD-10-CM | POA: Insufficient documentation

## 2024-11-23 DIAGNOSIS — R404 Transient alteration of awareness: Secondary | ICD-10-CM

## 2024-11-23 DIAGNOSIS — R413 Other amnesia: Secondary | ICD-10-CM | POA: Diagnosis not present

## 2024-11-23 DIAGNOSIS — H8110 Benign paroxysmal vertigo, unspecified ear: Secondary | ICD-10-CM | POA: Insufficient documentation

## 2024-11-23 DIAGNOSIS — K5904 Chronic idiopathic constipation: Secondary | ICD-10-CM | POA: Insufficient documentation

## 2024-11-23 DIAGNOSIS — E785 Hyperlipidemia, unspecified: Secondary | ICD-10-CM | POA: Insufficient documentation

## 2024-11-23 DIAGNOSIS — R4189 Other symptoms and signs involving cognitive functions and awareness: Secondary | ICD-10-CM | POA: Insufficient documentation

## 2024-11-23 DIAGNOSIS — R42 Dizziness and giddiness: Secondary | ICD-10-CM | POA: Insufficient documentation

## 2024-11-23 DIAGNOSIS — R1111 Vomiting without nausea: Secondary | ICD-10-CM | POA: Insufficient documentation

## 2024-11-23 NOTE — Patient Instructions (Signed)
 It was a pleasure to see you today at our office.   Recommendations:  Neurocognitive evaluation at our office   MRI of the brain, the radiology office will call you to arrange you appointment   Referral to psychology Check labs today   Follow up sleep apnea EEG to rule out seizure    [email or web address]   RECOMMENDATIONS FOR ALL PATIENTS WITH MEMORY PROBLEMS: 1. Continue to exercise (Recommend 30 minutes of walking everyday, or 3 hours every week) 2. Increase social interactions - continue going to [place] and enjoy social gatherings with friends and family 3. Eat healthy, avoid fried foods and eat more fruits and vegetables 4. Maintain adequate blood pressure, blood sugar, and blood cholesterol level. Reducing the risk of stroke and cardiovascular disease also helps promoting better memory. 5. Avoid stressful situations. Live a simple life and avoid aggravations. Organize your time and prepare for the next day in anticipation. 6. Sleep well, avoid any interruptions of sleep and avoid any distractions in the bedroom that may interfere with adequate sleep quality 7. Avoid sugar, avoid sweets as there is a strong link between excessive sugar intake, diabetes, and cognitive impairment We discussed the Mediterranean diet, which has been shown to help patients reduce the risk of progressive memory disorders and reduces cardiovascular risk. This includes eating fish, eat fruits and green leafy vegetables, nuts like almonds and hazelnuts, walnuts, and also use olive oil. Avoid fast foods and fried foods as much as possible. Avoid sweets and sugar as sugar use has been linked to worsening of memory function.  There is always a concern of gradual progression of memory problems. If this is the case, then we may need to adjust level of care according to patient needs. Support, both to the patient and caregiver, should then be put into place.       You have been referred for a neuropsychological evaluation (i.e., evaluation of memory and thinking abilities). Please bring someone with you to this appointment if possible, as it is helpful for the doctor to hear from both you and another adult who knows you well. Please bring eyeglasses and hearing aids if you wear them.    The evaluation will take approximately 3 hours and has two parts:   The first part is a clinical interview with the neuropsychologist (Dr. Richie or Dr. Gayland). During the interview, the neuropsychologist will speak with you and the individual you brought to the appointment.    The second part of the evaluation is testing with the doctor's technician Neal or Luke). During the testing, the technician will ask you to remember different types of material, solve problems, and answer some questionnaires. Your family member will not be present for this portion of the evaluation.   Please note: We must reserve several hours of the neuropsychologist's time and the psychometrician's time for your evaluation appointment. As such, there is a No-Show fee of $100. If you are unable to attend any of your appointments, please contact our office as soon as possible to reschedule.      DRIVING: Regarding driving, in patients with progressive memory problems, driving will be impaired. We advise to have someone else do the driving if trouble finding directions or if minor accidents are reported. Independent driving assessment is available to determine safety of driving.   If you are interested in the driving assessment, you can contact the following:  [facility] [employer] [facility] in [place] [phone]  [facility] [phone]  [facility] [phone]  [facility] [phone] or [phone]   FALL PRECAUTIONS: Be cautious when walking. Scan the area for obstacles that may increase the risk of trips and falls. When getting up in the  mornings, sit up at the edge of the bed for a few minutes before getting out of bed. Consider elevating the bed at the head end to avoid drop of blood pressure when getting up. Walk always in a well-lit room (use night lights in the walls). Avoid area rugs or power cords from appliances in the middle of the walkways. Use a walker or a cane if necessary and consider physical therapy for balance exercise. Get your eyesight checked regularly.  FINANCIAL OVERSIGHT: Supervision, especially oversight when making financial decisions or transactions is also recommended.  HOME SAFETY: Consider the safety of the kitchen when operating appliances like stoves, microwave oven, and blender. Consider having supervision and share cooking responsibilities until no longer able to participate in those. Accidents with firearms and other hazards in the house should be identified and addressed as well.   ABILITY TO BE LEFT ALONE: If patient is unable to contact 911 operator, consider using LifeLine, or when the need is there, arrange for someone to stay with patients. Smoking is a fire hazard, consider supervision or cessation. Risk of wandering should be assessed by caregiver and if detected at any point, supervision and safe proof recommendations should be instituted.  MEDICATION SUPERVISION: Inability to self-administer medication needs to be constantly addressed. Implement a mechanism to ensure safe administration of the medications.      Mediterranean Diet A Mediterranean diet refers to food and lifestyle choices that are based on the traditions of countries located on the [employer] [place]. This way of eating has been shown to help prevent certain conditions and improve outcomes for people who have chronic diseases, like kidney disease and heart disease. What are tips for following this plan? Lifestyle  Cook and eat meals together with your family, when possible. Drink enough fluid to keep your urine clear or  pale yellow. Be physically active every day. This includes: Aerobic exercise like running or swimming. Leisure activities like gardening, walking, or housework. Get 7-8 hours of sleep each night. If recommended by your health care provider, drink red wine in moderation. This means 1 glass a day for nonpregnant women and 2 glasses a day for men. A glass of wine equals 5 oz (150 mL). Reading food labels  Check the serving size of packaged foods. For foods such as rice and pasta, the serving size refers to the amount of cooked product, not dry. Check the total fat in packaged foods. Avoid foods that have saturated fat or trans fats. Check the ingredients list for added sugars, such as corn syrup. Shopping  At the grocery store, buy most of your food from the areas near the walls of the store. This includes: Fresh fruits and vegetables (produce). Grains, beans, nuts, and seeds. Some of these may be available in unpackaged forms or large amounts (in bulk). Fresh seafood. Poultry and eggs. Low-fat dairy products. Buy whole ingredients instead of prepackaged foods. Buy fresh fruits and vegetables in-season from local farmers markets. Buy frozen fruits and vegetables in resealable bags. If you do not have access to quality fresh seafood, buy precooked frozen shrimp or canned fish, such as tuna, salmon, or sardines. Buy small amounts of raw or cooked vegetables, salads, or olives from the deli or salad bar at your store. Stock your pantry so you always have certain  foods on hand, such as olive oil, canned tuna, canned tomatoes, rice, pasta, and beans. Cooking  Cook foods with extra-virgin olive oil instead of using butter or other vegetable oils. Have meat as a side dish, and have vegetables or grains as your main dish. This means having meat in small portions or adding small amounts of meat to foods like pasta or stew. Use beans or vegetables instead of meat in common dishes like chili or  lasagna. Experiment with different cooking methods. Try roasting or broiling vegetables instead of steaming or sauteing them. Add frozen vegetables to soups, stews, pasta, or rice. Add nuts or seeds for added healthy fat at each meal. You can add these to yogurt, salads, or vegetable dishes. Marinate fish or vegetables using olive oil, lemon juice, garlic, and fresh herbs. Meal planning  Plan to eat 1 vegetarian meal one day each week. Try to work up to 2 vegetarian meals, if possible. Eat seafood 2 or more times a week. Have healthy snacks readily available, such as: Vegetable sticks with hummus. Greek yogurt. Fruit and nut trail mix. Eat balanced meals throughout the week. This includes: Fruit: 2-3 servings a day Vegetables: 4-5 servings a day Low-fat dairy: 2 servings a day Fish, poultry, or lean meat: 1 serving a day Beans and legumes: 2 or more servings a week Nuts and seeds: 1-2 servings a day Whole grains: 6-8 servings a day Extra-virgin olive oil: 3-4 servings a day Limit red meat and sweets to only a few servings a month What are my food choices? Mediterranean diet Recommended Grains: Whole-grain pasta. Brown rice. Bulgar wheat. Polenta. Couscous. Whole-wheat bread. [place] Made[place]. Vegetables: Artichokes. Beets. Broccoli. Cabbage. Carrots. Eggplant. Green beans. Chard. Kale. Spinach. Onions. Leeks. Peas. Squash. Tomatoes. Peppers. Radishes. Fruits: Apples. Apricots. Avocado. Berries. Bananas. Cherries. Dates. Figs. Grapes. Lemons. Melon. Oranges. Peaches. Plums. Pomegranate. Meats and other protein foods: Beans. Almonds. Sunflower seeds. Pine nuts. Peanuts. Cod. Salmon. Scallops. Shrimp. Tuna. Tilapia. Clams. Oysters. Eggs. Dairy: Low-fat milk. Cheese. Greek yogurt. Beverages: Water . Red wine. Herbal tea. Fats and oils: Extra virgin olive oil. Avocado oil. Grape seed oil. Sweets and desserts: Greek yogurt with honey. Baked apples. Poached pears. Trail mix. Seasoning and  other foods: Basil. Cilantro. Coriander. Cumin. Mint. Parsley. Sage. Rosemary. Tarragon. Garlic. Oregano. Thyme. Pepper. Balsalmic vinegar. Tahini. Hummus. Tomato sauce. Olives. Mushrooms. Limit these Grains: Prepackaged pasta or rice dishes. Prepackaged cereal with added sugar. Vegetables: Deep fried potatoes (french fries). Fruits: Fruit canned in syrup. Meats and other protein foods: Beef. Pork. Lamb. Poultry with skin. Hot dogs. [place]. Dairy: Ice cream. Sour cream. Whole milk. Beverages: Juice. Sugar-sweetened soft drinks. Beer. Liquor and spirits. Fats and oils: Butter. Canola oil. Vegetable oil. Beef fat (tallow). Lard. Sweets and desserts: Cookies. Cakes. Pies. Candy. Seasoning and other foods: Mayonnaise. Premade sauces and marinades. The items listed may not be a complete list. Talk with your dietitian about what dietary choices are right for you. Summary The Mediterranean diet includes both food and lifestyle choices. Eat a variety of fresh fruits and vegetables, beans, nuts, seeds, and whole grains. Limit the amount of red meat and sweets that you eat. Talk with your health care provider about whether it is safe for you to drink red wine in moderation. This means 1 glass a day for nonpregnant women and 2 glasses a day for men. A glass of wine equals 5 oz (150 mL). This information is not intended to replace advice given to you by your health care provider.  Make sure you discuss any questions you have with your health care provider. Document Released: 06/21/2016 Document Revised: 07/24/2016 Document Reviewed: 06/21/2016 Elsevier Interactive Patient Education  2017 [employer].

## 2024-11-24 ENCOUNTER — Institutional Professional Consult (permissible substitution): Payer: Self-pay | Admitting: Psychology

## 2024-11-24 ENCOUNTER — Encounter: Payer: Self-pay | Admitting: Psychology

## 2024-11-24 ENCOUNTER — Other Ambulatory Visit: Payer: Self-pay

## 2024-11-24 ENCOUNTER — Ambulatory Visit: Payer: Self-pay

## 2024-11-25 ENCOUNTER — Encounter: Payer: Self-pay | Admitting: Physician Assistant

## 2024-11-25 ENCOUNTER — Ambulatory Visit

## 2024-11-25 DIAGNOSIS — R404 Transient alteration of awareness: Secondary | ICD-10-CM

## 2024-11-25 NOTE — Progress Notes (Signed)
 EEG complete and ready for review.

## 2024-11-26 ENCOUNTER — Ambulatory Visit: Payer: Self-pay | Admitting: Physician Assistant

## 2024-11-26 NOTE — Procedures (Signed)
 ELECTROENCEPHALOGRAM REPORT  Date of Study: 11/25/2024  Patient's Name: Kendra Castro MRN: [id number] Date of Birth: Jun 10, 1966  Referring Provider: Camie Sevin, PA-C  Clinical History: This is a 59 year old woman with memory loss, episodes of disorientation while driving. EEG for classification.  Medications: Synthroid, Hyzaar, Norvasc   Technical Summary: A multichannel digital EEG recording measured by the international 10-20 system with electrodes applied with paste and impedances below 5000 ohms performed in our laboratory with EKG monitoring in an awake and drowsy patient.  Hyperventilation was not performed. Photic stimulation was performed.  The digital EEG was referentially recorded, reformatted, and digitally filtered in a variety of bipolar and referential montages for optimal display.    Description: The patient is awake and drowsy during the recording.  During maximal wakefulness, there is a symmetric, medium voltage 10 Hz posterior dominant rhythm that attenuates with eye opening.  The record is symmetric.  During drowsiness, there is an increase in theta slowing of the background. Sleep was not captured. Photic stimulation did not elicit any abnormalities.  There were no epileptiform discharges or electrographic seizures seen.    EKG lead was unremarkable.  Impression: This awake and drowsy EEG is normal.    Clinical Correlation: A normal EEG does not exclude a clinical diagnosis of epilepsy.  If further clinical questions remain, prolonged EEG may be helpful.  Clinical correlation is advised.   Darice Shivers, M.D.

## 2024-12-01 ENCOUNTER — Encounter: Payer: Self-pay | Admitting: Psychology

## 2024-12-02 ENCOUNTER — Inpatient Hospital Stay
Admission: RE | Admit: 2024-12-02 | Discharge: 2024-12-02 | Attending: Physician Assistant | Admitting: Physician Assistant

## 2024-12-02 DIAGNOSIS — R413 Other amnesia: Secondary | ICD-10-CM

## 2024-12-04 ENCOUNTER — Other Ambulatory Visit

## 2024-12-15 ENCOUNTER — Encounter: Payer: Self-pay | Admitting: Physician Assistant

## 2024-12-22 ENCOUNTER — Institutional Professional Consult (permissible substitution): Admitting: Psychology

## 2024-12-22 ENCOUNTER — Ambulatory Visit

## 2025-01-01 ENCOUNTER — Encounter: Admitting: Psychology

## 2025-01-04 ENCOUNTER — Ambulatory Visit: Payer: Self-pay | Admitting: Physician Assistant
